# Patient Record
Sex: Female | Born: 1980 | Race: Black or African American | Hispanic: No | Marital: Married
Health system: Southern US, Community
[De-identification: ages and names within clinical notes are randomized; demographics above are authoritative.]

## PROBLEM LIST (undated history)

## (undated) DIAGNOSIS — N736 Female pelvic peritoneal adhesions (postinfective): Secondary | ICD-10-CM

## (undated) DIAGNOSIS — K611 Rectal abscess: Secondary | ICD-10-CM

## (undated) DIAGNOSIS — K5090 Crohn's disease, unspecified, without complications: Secondary | ICD-10-CM

## (undated) DIAGNOSIS — IMO0002 Reserved for concepts with insufficient information to code with codable children: Secondary | ICD-10-CM

## (undated) DIAGNOSIS — Z8619 Personal history of other infectious and parasitic diseases: Secondary | ICD-10-CM

## (undated) DIAGNOSIS — Z8742 Personal history of other diseases of the female genital tract: Secondary | ICD-10-CM

## (undated) HISTORY — DX: Reserved for concepts with insufficient information to code with codable children: IMO0002

## (undated) HISTORY — PX: COLONOSCOPY: SHX174

## (undated) HISTORY — DX: Rectal abscess: K61.1

## (undated) HISTORY — PX: LAPAROSCOPIC LYSIS INTESTINAL ADHESIONS: SUR778

## (undated) HISTORY — PX: OTHER SURGICAL HISTORY: SHX169

---

## 2001-10-20 ENCOUNTER — Encounter: Payer: Self-pay | Admitting: Emergency Medicine

## 2001-10-21 ENCOUNTER — Inpatient Hospital Stay (HOSPITAL_COMMUNITY): Admission: EM | Admit: 2001-10-21 | Discharge: 2001-10-26 | Payer: Self-pay | Admitting: Emergency Medicine

## 2001-10-23 ENCOUNTER — Encounter: Payer: Self-pay | Admitting: Internal Medicine

## 2001-11-15 ENCOUNTER — Encounter: Admission: RE | Admit: 2001-11-15 | Discharge: 2001-11-15 | Payer: Self-pay | Admitting: Internal Medicine

## 2001-12-28 ENCOUNTER — Other Ambulatory Visit: Admission: RE | Admit: 2001-12-28 | Discharge: 2001-12-28 | Payer: Self-pay | Admitting: Obstetrics and Gynecology

## 2001-12-28 ENCOUNTER — Encounter: Admission: RE | Admit: 2001-12-28 | Discharge: 2001-12-28 | Payer: Self-pay | Admitting: *Deleted

## 2002-11-22 ENCOUNTER — Encounter (INDEPENDENT_AMBULATORY_CARE_PROVIDER_SITE_OTHER): Payer: Self-pay

## 2002-11-22 ENCOUNTER — Other Ambulatory Visit: Admission: RE | Admit: 2002-11-22 | Discharge: 2002-11-22 | Payer: Self-pay | Admitting: Obstetrics and Gynecology

## 2002-11-22 ENCOUNTER — Encounter: Admission: RE | Admit: 2002-11-22 | Discharge: 2002-11-22 | Payer: Self-pay | Admitting: Obstetrics and Gynecology

## 2002-11-22 ENCOUNTER — Encounter (INDEPENDENT_AMBULATORY_CARE_PROVIDER_SITE_OTHER): Payer: Self-pay | Admitting: *Deleted

## 2003-01-15 ENCOUNTER — Emergency Department (HOSPITAL_COMMUNITY): Admission: EM | Admit: 2003-01-15 | Discharge: 2003-01-15 | Payer: Self-pay | Admitting: Emergency Medicine

## 2003-01-15 ENCOUNTER — Encounter: Payer: Self-pay | Admitting: Emergency Medicine

## 2003-09-28 HISTORY — PX: OTHER SURGICAL HISTORY: SHX169

## 2004-06-24 ENCOUNTER — Emergency Department (HOSPITAL_COMMUNITY): Admission: EM | Admit: 2004-06-24 | Discharge: 2004-06-25 | Payer: Self-pay | Admitting: Emergency Medicine

## 2004-06-25 ENCOUNTER — Inpatient Hospital Stay (HOSPITAL_COMMUNITY): Admission: AD | Admit: 2004-06-25 | Discharge: 2004-06-28 | Payer: Self-pay | Admitting: Family Medicine

## 2004-06-25 ENCOUNTER — Ambulatory Visit: Payer: Self-pay | Admitting: Family Medicine

## 2004-10-26 ENCOUNTER — Emergency Department (HOSPITAL_COMMUNITY): Admission: EM | Admit: 2004-10-26 | Discharge: 2004-10-26 | Payer: Self-pay | Admitting: Emergency Medicine

## 2004-11-01 ENCOUNTER — Emergency Department (HOSPITAL_COMMUNITY): Admission: EM | Admit: 2004-11-01 | Discharge: 2004-11-01 | Payer: Self-pay | Admitting: Emergency Medicine

## 2004-11-06 ENCOUNTER — Ambulatory Visit (HOSPITAL_COMMUNITY): Admission: RE | Admit: 2004-11-06 | Discharge: 2004-11-06 | Payer: Self-pay | Admitting: Gastroenterology

## 2004-11-06 ENCOUNTER — Encounter (INDEPENDENT_AMBULATORY_CARE_PROVIDER_SITE_OTHER): Payer: Self-pay | Admitting: Specialist

## 2004-11-24 ENCOUNTER — Ambulatory Visit: Payer: Self-pay | Admitting: Obstetrics & Gynecology

## 2004-12-17 ENCOUNTER — Emergency Department (HOSPITAL_COMMUNITY): Admission: EM | Admit: 2004-12-17 | Discharge: 2004-12-17 | Payer: Self-pay | Admitting: Emergency Medicine

## 2004-12-18 ENCOUNTER — Emergency Department (HOSPITAL_COMMUNITY): Admission: EM | Admit: 2004-12-18 | Discharge: 2004-12-18 | Payer: Self-pay | Admitting: Emergency Medicine

## 2008-04-22 ENCOUNTER — Emergency Department (HOSPITAL_COMMUNITY): Admission: EM | Admit: 2008-04-22 | Discharge: 2008-04-23 | Payer: Self-pay | Admitting: Emergency Medicine

## 2010-05-30 ENCOUNTER — Ambulatory Visit: Payer: Self-pay | Admitting: Gynecology

## 2010-05-30 ENCOUNTER — Inpatient Hospital Stay (HOSPITAL_COMMUNITY): Admission: AD | Admit: 2010-05-30 | Discharge: 2010-05-30 | Payer: Self-pay | Admitting: Obstetrics and Gynecology

## 2010-12-10 LAB — WET PREP, GENITAL
Clue Cells Wet Prep HPF POC: NONE SEEN
Trich, Wet Prep: NONE SEEN
Yeast Wet Prep HPF POC: NONE SEEN

## 2010-12-10 LAB — GC/CHLAMYDIA PROBE AMP, GENITAL
Chlamydia, DNA Probe: NEGATIVE
GC Probe Amp, Genital: NEGATIVE

## 2011-02-04 ENCOUNTER — Ambulatory Visit (HOSPITAL_COMMUNITY)
Admission: RE | Admit: 2011-02-04 | Discharge: 2011-02-04 | Disposition: A | Discharge status: Home / Self Care | Payer: Self-pay | Source: Ambulatory Visit | Attending: Gastroenterology | Admitting: Gastroenterology

## 2011-02-04 DIAGNOSIS — Z9049 Acquired absence of other specified parts of digestive tract: Secondary | ICD-10-CM | POA: Insufficient documentation

## 2011-02-04 DIAGNOSIS — K5090 Crohn's disease, unspecified, without complications: Secondary | ICD-10-CM | POA: Insufficient documentation

## 2011-02-04 DIAGNOSIS — R197 Diarrhea, unspecified: Secondary | ICD-10-CM | POA: Insufficient documentation

## 2011-02-04 DIAGNOSIS — Z9079 Acquired absence of other genital organ(s): Secondary | ICD-10-CM | POA: Insufficient documentation

## 2011-02-12 NOTE — Discharge Summary (Signed)
[place]. [facility]  Patient:    Sharon [name], Sharon [name] Visit Number: [id number] MRN: [id number]          Service Type: MED Location: [phone] Attending Physician:  Estella Husk Dictated by:   Dianah Field, P.A.-C Admit Date:  10/20/2001 Discharge Date: 10/26/2001   CC:         Vikki Ports, M.D.  Marisue Brooklyn, M.D.  Wilhemina Bonito. Eda Keys., M.D.  Dr. Gerlene Burdock at the [facility] [address], [place] [address]             0865 along with original discharge summary   Discharge Summary  ADDENDUM  LABORATORY DATA:  Stool culture was negative for Salmonella, Shigella, Campylobacter, or Yersinia.  Stool for C. diff toxin was negative.  Stool for ova and parasites was negative.  White blood cell count was ranged 10.4 to 7.0.  Hemoglobin ranged 9.6 to 8.1.  MCV was 83.1, platelets were 417,000.  On differential there was a left shift present.  Sedimentation rate ranged from 130 down to 106.  Sodium 140, potassium 3.8, chloride 105.  Glucose 90.  BUN was 5, creatinine 0.7, albumin 2.5.  Lipase was 21.  Total bilirubin 0.6, alkaline phosphatase 59, AST 9, ALT 9.  ADDITIONAL DISCHARGE DIAGNOSIS:  Normocytic anemia.  Anemia was discussed with Dr. Marina Goodell, and decision was made not to start her on iron at this point because it may upset her stomach and cause problems. However, she was advised to pick up a generic brand of multivitamin with iron and to start taking this.  On return office visit with Dr. Marina Goodell or with the internal medicine clinic, she may be able to start on some iron, and formal iron studies may be in order as an outpatient.  Add this to dictation number [id number]. Dictated by:   Dianah Field, P.A.-C Attending Physician:  Estella Husk DD:  10/26/01 TD:  10/26/01 Job: [id number]

## 2011-02-12 NOTE — Discharge Summary (Signed)
[name], Sharon                ACCOUNT NO.:  [id number]   MEDICAL RECORD NO.:  [id number]          PATIENT TYPE:  INP   LOCATION:  5714                         FACILITY:  [facility]   PHYSICIAN:  Douglass Rivers, M.D.   DATE OF BIRTH:  [birth year]   DATE OF ADMISSION:  06/25/2004  DATE OF DISCHARGE:  06/28/2004                                 DISCHARGE SUMMARY   CONSULTS:  Surgery consult with Dr. Gerrit Friends, interventional radiology.   DISCHARGE DIAGNOSES:  1.  Right pelvic abscess versus fluid collection.  2.  Crohn's disease.  3.  Anemia.  4.  Anxiety disorder.  5.  Poor intravenous access.  6.  History of genital warts.  7.  Status post colectomy in 1/05.  8.  Abdominal hernia repair in 1/05.  9.  Right oophorectomy in 1/05.   DISCHARGE MEDICATIONS:  1.  Cipro 500 mg b.i.d. x3 weeks total.  2.  Flagyl 500 mg p.o. b.i.d. x3 weeks total.  3.  Pentasa 1000 mg q.i.d.  4.  Vicodin 500/5, 1-3 tabs q.6h. p.r.n. pain.  5.  Klonopin 0.5 mg, 1 p.o. b.i.d. p.r.n. anxiety.  6.  Atarax 50 mg, 1 p.o. q.6h. p.r.n. itching.  7.  Prilosec 20 mg p.o. every day.   OTHER DIRECTIONS:  The patient was instructed to seek health care if she  were to develop fever, persistent vomiting or worsening abdominal pain.   FOLLOWUP:  The patient was unclear specifically where she wishes to have  outpatient followup in the [place] area.  She was provided with the  number at [facility] at [phone].  She will need to call to  make an appointment after getting her Medicaid card in two weeks.  If,  however, there is any delay or any problem, it is offered for patient to be  followed up as an outpatient sooner at the [facility] in one to  two weeks, and phone number was provided at [phone].   HOSPITAL COURSE:  Problem 1. Sharon [name] is a 30 year old with Crohn's disease  who presented on 9/29 with complaint of abdominal pain without associated  fever or white cell count.  CT scan  showed a right-sided abscess in the  small bowel versus a TOA.  We received information later on from [facility] in [place] that clarified that the patient underwent  resection of her right ovary and fallopian tube.  Following this, the  patient was felt to have either a postoperative fluid collection in the area  of the ovary or possibly an abscess.  Both interventional radiology and the  on-call gynecologist were consulted and neither one felt that a surgical  intervention for drainage of this fluid collection would be in the patient's  best interest and as a matter of fact felt that it may do her more harm and  may set her up for high risk of developing fistulas, which she has had in  the past.  Patient was initiated on p.o. Flagyl and p.o. Cipro, which she  tolerated the entire hospital stay.  She continued to be  afebrile without a  significantly elevated white count.  For further evaluation, a vaginal  ultrasound was performed on 06/26/04, revealed a complex right adnexal  process favored ovarian complex cyst and less consistent with a bowel or  tubo-ovarian abscess, though they felt that they were also possible  considerations.  The patient proceeded to do well on p.o. antibiotics.  On  the day of discharge she was remaining to have pain, but much significantly  less than she had had before.  She was tolerating p.o. pain medicines.  She  was tolerating a regular diet.  She was able to ambulate to the bathroom  without difficulty.  She was discharged to home with a plan to obtain some  samples from the pharmacy to help in her pharmacy costs until her Medicaid  comes through.  As stated in the discharge instructions, she was instructed  to return if she should have increasing pain, fevers or other symptoms and  is to follow up in approximately one to two weeks.   Problem 2. Anxiety disorder.  On multiple occasions, the patient appeared to  have significant issues with  regards to anxiety, and had in addition some  unpleasant interactions with some of the medical staff.  Most of this  appeared to revolve around her own concern for adequate diagnosis and care.  Of note, patient had been treated in the past with Librium and Zoloft.  Dr.  Jonny Ruiz spent approximately an hour with her trying to reinitiate therapy with  an SSRI and she was unwilling to do this at this time.  She did accept a  trial of Klonopin 0.5 mg b.i.d. and this seemed to work adequately for the  moment.  She is to be discharged on this.  It is an understanding as an  outpatient the need to continue to try and work with her to attempt to try  to ascertain daily therapy and thus control of her anxiety symptoms.   Problem 3. Crohn's disease, appeared to be stable throughout the hospital  course.  The patient was reinstated on Pentasa.  She was tolerating a  regular diet.  She did have some complaint of some perirectal pain which was  treated with some sitz baths.   Problem 4. Anemia.  The patient's hemoglobin on admission was 11.2.  The  patient was treated empirically on some iron therapy.  Further workup if  this persists may be done on an outpatient basis.   Problem 5. Poor access.  The patient has had multiple problems with poor  intravenous access.  She had an IV for a short period of time and a PIC line  was considered.  However, as patient did not require IV medicines, this was  not pursued during this hospitalization.   DISPOSITION:  The patient was discharged to home with samples of medicine as  pharmacy and social work would allow.  She was given a note for work or  school to be able to stay out for one to two weeks until her strength has  returned.       CH/MEDQ  D:  06/28/2004  T:  06/29/2004  Job:  [id number]   cc:   [facility]   Velora Heckler, M.D.  [address] [place]  [place] [address]  Fax: [phone]

## 2011-02-12 NOTE — H&P (Signed)
Sharon, [name]                ACCOUNT NO.:  [id number]   MEDICAL RECORD NO.:  [id number]          PATIENT TYPE:  EMS   LOCATION:  MAJO                         FACILITY:  [facility]   PHYSICIAN:  Madeleine B. Vanstory, M.D.DATE OF BIRTH:  1980/11/19   DATE OF ADMISSION:  06/24/2004  DATE OF DISCHARGE:                                HISTORY & PHYSICAL   HISTORY OF PRESENT ILLNESS:  The patient is a 30 year old female with a 3-  day history of abdominal pain, epigastric and right-sided, not associated  with food and patient has had no change in p.o. Positive nausea, positive  diarrhea which is chronic.  Negative for vomiting.  Negative fevers and  chills. The patient was diagnosed with Crohn's in 1996.  She had to have  about 5 hospitalizations in the last year at [facility] and [place]. She does not  have a GI doctor here in [place] and is not currently taking any  medications.  She has history of abscess x2 which were treated with  drainage.  Her last menstrual period was May 28, 2004.   REVIEW OF SYSTEMS:  Positive for weight change, she states a 15 pound  fluctuation up and down every 3 days.  Positive for abdominal pain.  Negative for chest pain, shortness of breath, constipation, headaches,  fevers, chills, hematuria, dyspareunia.   PAST MEDICAL HISTORY:  1.  Crohn's disease.  2.  Genital warts.  3.  Anxiety.   PAST SURGICAL HISTORY:  Colectomy in January, 2005, abdominal hernia repair  in January, 2005.  Right oophorectomy, January, 2005, unsure why.   MEDICATIONS:  The patient is not currently taking any medications.   ALLERGIES:  No known drug allergies.   FAMILY HISTORY:  Dad:  sleep apnea and asthma.  Mom:  bipolar.   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  No pets. Denies smoking, ETOH or illicit drugs.   PHYSICAL EXAMINATION:  VITAL SIGNS:  Temperature 97.3, pulse 80's, 20's,  blood pressure 107 to 130/60  to 95. Temperature 98, saturation 100% on room  air.  GENERAL:  The patient is lying in bed in no acute distress though appears to  be in pain.  HEENT:  Extraocular muscles intact.  Pupils are equal, round and reactive to  light, questionable exophthalmos.  NECK:  Negative lymphadenopathy, no thyromegaly.  CARDIOVASCULAR:  Regular rate and rhythm.  RESPIRATORY:  Clear to auscultation bilaterally. No cyanosis, no clubbing.  ABDOMEN:  Tender to palpation right greater than left side, positive  guarding, no rebound tenderness.  EXTREMITIES:  Warm, 2+ peripheral pulses, free range of motion.  Five +  strength bilaterally.  RECTAL:  Perianal hyperpigmentation, no frank blood per rectum.  No stool in  the vault, Hemoccult pending.   LABORATORY DATA:  Urinalysis negative, positive for small leukocyte  esterase, otherwise negative.  Pregnancy test negative.  Microscopic UA:  rare bacteria, rare epithelium.  White count 7400, hematocrit 30.21,  hemoglobin 11.2, platelets 299, 000.   CT of the abdomen showed a 3.5 x 4.5  cm abscess in the right pelvis, mildly  thickened small bowel, questionable Crohn's versus TOA though patient has  had right oophorectomy.   ASSESSMENT AND PLAN:  This is a 30 year old female with:  1.  Abdominal pain, likely with Crohn's flare as patient is noncompliant.      Of note, no GI follow up. CT shows right sided abscess, small bowel      versus TOA but again patient has had history of a right oophorectomy      (unsure why).  Pregnancy test negative therefore not ectopic pregnancy.      No fever, increased white blood count and no signs of appendicitis on      CT.  No history of recent surgery or signs of obstruction.  Will treat      patient with Flagyl 500 mg p.o. t.i.d. and Cipro 500 mg p.o. b.i.d. and      restart her Pentasa 1000 mg q.i.d., and Darvocet 50/325 q.4h p.r.n.      pain., Phenergan 12.5 mg p.o. q.4-5h p.r.n. nausea.  Will make patient      n.p.o.   Gastroenterology consult in the a.m.  May need steroid and/or a      pelvic exam.   1.  Other.  The patient is requesting to leave and come back to go home to      clean up her room before her landlord comes over, though it is 3:30 in      the morning.  She then states she has a cup of urine in the bathroom      that she needs to throw away because she is embarrassed.  Then states      she has to go home to get notes.  I told her if she leaves it will be      AMA, that she can call to get help with the aforementioned scenario.       MBV/MEDQ  D:  06/25/2004  T:  06/25/2004  Job:  [id number]

## 2011-02-12 NOTE — Group Therapy Note (Signed)
Sharon [name], Sharon [name]                ACCOUNT NO.:  [id number]   MEDICAL RECORD NO.:  [id number]          PATIENT TYPE:  WOC   LOCATION:  [facility]                   FACILITY:  [facility]   PHYSICIAN:  Elsie Lincoln, MD      DATE OF BIRTH:  11/18/1980   DATE OF SERVICE:  11/24/2004                                    CLINIC NOTE   REASON FOR VISIT:  The patient is a 30 year old female who presents after no  showing after an abnormal Pap smear. 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 The biopsy of the cervix following that shows  slight dysplasia. Back in 2003 she did have moderate dysplasia consistent  with high-grade; however, never underwent LEEP, and the patient has not come  back since February 2004. We talked about her noncompliance and she agrees  to be compliant from now on. She also is requesting a complete sexually-  transmitted disease screen. She denies any high-risk behavior including IV  drug use, anal sex, travel to countries where HIV is prevalent, or having  sex with people who have high-risk behavior. She has been with her boyfriend  for 7 years. She denies him not being monogamous. I do have some feeling  that I am not getting the complete story. However, we will test her for  everything. The patient has a history of Crohn's disease and has active  perianal fistulas that she says does not bother her. The patient also has  recent perineal itching but no discharge. This itching could be related to  the fistulas.   PHYSICAL EXAMINATION:  Several fistulas are surrounding the anus. The one on  the right is slightly weeping. External genitalia:  No warts or lesions.  Some evidence of poor hygiene in the labia majora. Vagina pink, normal  rugae, no discharge, no blood. Cervix closed, no evidence of infection.  There is a small amount of physiologic discharge.   ASSESSMENT AND PLAN:  A 29 year old female with abnormal Pap smear for  follow-up and  sexually-transmitted disease screening.   1.  GC, chlamydia, wet prep, RPR, HIV, hepatitis B surface antigen, and      hepatitis C virus sent.  2.  Will treat empirically for yeast with Diflucan 150 mg p.o. x1 and repeat      in 3 days if needed.  3.  Return to clinic in 2 weeks for results. The patient will most likely      call in earlier for results as she is very anxious to receive these.      This is another reason why I think that I am not getting the entire      story.  4.  If results are negative, R.N. can give results over the phone.      KL/MEDQ  D:  11/24/2004  T:  11/25/2004  Job:  [id number]

## 2011-02-12 NOTE — H&P (Signed)
[place]. [facility]  Patient:    Sharon [name], Sharon [name] Visit Number: [id number] MRN: [id number]          Service Type: MED Location: [phone] Attending Physician:  Estella Husk Dictated by:   Sammuel Cooper, P.A. Admit Date:  10/20/2001                           History and Physical  CHIEF COMPLAINT:  Abdominal pain and right shoulder and right knee pain x1 week.  HISTORY:  Sharon [name] is a pleasant 30 year old [place] American female, student at [employer], who has history of Crohns disease which was apparently diagnosed about two years ago.  She had initially been followed by a Dr. Carole Civil in [place] and had more recently seen a doctor in [place], [place].  She has not established a GI physician in [place].  She does not have any other medical problems and has not required any surgery.  She said she was placed on steroids in June of 2002 when she had a flare and gradually weaned off of all of her medication over the past several months.  She now presents with increase in right-sided abdominal pain, which she says is fairly chronic for her, though worse over the past week, and has developed right knee and right shoulder pain, which were unusual.  She had not had any documented fever or chills.  She has been nauseated but had not had any vomiting at home.  She does have some periodic diarrhea, has not noticed any melena or hematochezia. Her weight had been stable over the past few months but she had lost approximately 30 pounds within the past year or so.  She admits that she does not generally take medications as she is supposed to and had stopped her Pentasa on her own.  The patient had been seen and evaluated in the emergency room, had undergone CT scan of the chest which was negative and CT of the abdomen and pelvis which did show thickened small bowel in the terminal ileum with some fluid in the pelvis, could not rule out loculated fluid  or abscess.  Exam was otherwise negative.  GI was called and the patient is admitted to the GI service at this time with probable Crohns exacerbation.  CURRENT MEDICATIONS:  None on a regular basis.  ALLERGIES:  No known drug allergies.  PAST HISTORY:  Benign other than Crohns, which was diagnosed approximately two years ago.  FAMILY HISTORY:  Patient says she is uncertain whether or not her mother has Crohns disease.  Apparently, her mother has had some psychological issues. Family history is otherwise negative as far as the patient is aware.  SOCIAL HISTORY:  Patient is a Consulting civil engineer at [employer], hopes to go to nursing school. She is currently living on campus.  She occasionally smokes socially and uses alcohol socially.  REVIEW OF SYSTEMS:  Negative other than described above.  She had no complaint of chest pain or anginal symptoms, no cough, shortness of breath or sputum production, no dysuria, urgency or frequency.  GI and musculoskeletal as outlined above.  LABORATORY DATA:  Labs pertinent for WBC of 10.4 and CMET unremarkable.  PHYSICAL EXAMINATION:  GENERAL:  Well-developed, thin, African American female in no acute distress on admission.  VITAL SIGNS:  Blood pressure 93/54, pulse is 83, temperature is 97.4.  HEENT:  Nontraumatic, normocephalic.  EOMI.  PERLA.  Sclerae anicteric.  NECK:  Supple.  CARDIOVASCULAR:  Regular rate and rhythm with S1 and S2.  No murmurs, rubs, or gallops.  PULMONARY:  Clear to P&A.  ABDOMEN:  Soft with minimal right midabdominal tenderness.  Bowel sounds were present.  There is no palpable mass or hepatosplenomegaly.  EXTREMITIES:  Without clubbing, cyanosis, or edema.  RECTAL:  Not done at the time of admission.  IMPRESSION: 1. [age]-year-old white female with Crohns ileitis with exacerbation. 2. Medical noncompliance.  PLAN:  Patient is admitted to the service of Dr. Wilhemina Bonito. Eda Keys. for IV fluid hydration.  She will be covered  with oral Cipro and Flagyl, given CT changes, will also resume her Pentasa, pain control and antiemetics as needed and depending on her clinical course, she may require IV Solu-Medrol and further diagnostic workup with barium studies, etc.  We will also need to obtain her prior colonoscopy and GI workup from [place].Dictated by:   Sammuel Cooper, P.A. Attending Physician:  Estella Husk DD:  10/22/01 TD:  10/23/01 Job: [id number]

## 2011-02-12 NOTE — Op Note (Signed)
NAMEMARGARUITE, TOP                ACCOUNT NO.:  [id number]   MEDICAL RECORD NO.:  [id number]          PATIENT TYPE:  AMB   LOCATION:  ENDO                         [facility]ACILITY:  [facility]   PHYSICIAN:  Danise Edge, M.D.   DATE OF BIRTH:  30-Aug-1981   DATE OF PROCEDURE:  11/06/2004  DATE OF DISCHARGE:                                 OPERATIVE REPORT   PROCEDURE:  Diagnostic colonoscopy.   INDICATIONS:  Sharon [name] is a 30 year old female born 01/12/1981.  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   Sharon [name] was diagnosed with Crohns disease in 1996 when she began passing  blood in her stool. She has undergone a segmental right colon resection,  abdominal wall hernia repair, and removal of her right ovary and tube in the  past.   On October 20, 2001, her abdominal ultrasound was normal. On October 23, 2001, her small-bowel follow-through x-ray series revealed Crohn's disease  involving the distal-terminal ileum.   October 27, 2003, transvaginal and pelvic ultrasound revealed a complex  right ovarian cyst.   October 26, 2004, CT scan of the abdomen and pelvis revealed an umbilical  hernia and a 7.5 x 9 cm pelvic cyst ? arising from the left ovary.   October 26, 2004, white blood cell count 13,300, hemoglobin 11.2 g,  normocytic indices, normal platelet count. Complete metabolic profile was  normal except for a serum albumin of 3.2. Serum lipase normal,  urine  pregnancy test negative, urinalysis normal.   Sharon [name] is experiencing intermittent urinary frequency without dysuria,  bilateral retro-orbital pressure, and nonbloody diarrhea.   Years ago, she was started on 6-MCP, but the drug was discontinued when she  failed to return for monitoring.   MEDICATION ALLERGIES:  None.   PAST MEDICAL HISTORY:  Crohn's ileitis, genital warts, anxiety segmental  right colon resection, abdominal hernia repair,  right oophorectomy and  salpingectomy.   FAMILY HISTORY:  Father has sleep apnea and asthma, mother is bipolar.   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   HABITS:  No history of cigarette smoking, alcohol use.   ENDOSCOPIST:  Danise Edge, M.D.   PREMEDICATION:  Versed 10 mg Demerol 70 mg.   PROCEDURE:  After obtaining informed consent,  Sharon [name] was placed in the  left lateral decubitus position. I administered intravenous Demerol and  intravenous Versed to achieve conscious sedation for the procedure. The  patient's blood pressure, oxygen saturation and cardiac rhythm were  monitored throughout the procedure and documented in the medical record.   Anal inspection revealed healed perianal fistulous disease. Digital rectal  exam is normal. The Olympus adjustable pediatric colonoscope was introduced  into the rectum and easily advanced to the ileo right colonic surgical  anastomosis. Colonic preparation for the exam today was excellent   RECTUM:  Normal.  SIGMOID COLON AND DESCENDING COLON:  Normal.  SPLENIC FLEXURE:  Normal.  TRANSVERSE COLON:  Normal.  HEPATIC FLEXURE:  Normal.  ASCENDING COLON:  Normal.  ILEO RIGHT  COLONIC SURGICAL ANASTOMOSIS:  Patent.  DISTAL ILEUM:  There are scattered aphthous type ulcers compatible with  Crohn's ileitis without the ulcers or obstruction.   ASSESSMENT:  Crohn's ileitis. Segmental right colon resection with widely  patent ileo right colonic surgical anastomosis. Normal rectal and colonic  mucosa.   PLAN:  1.  Sharon [name] has an upper respiratory tract infection and is running in      temperature of 101 degrees.  I am placing her on amoxicillin 500 mg      t.i.d. for 10 days.  2.  I am obtaining a free T4 and TSH level. She appears to have some degree      of proptosis.  3.  I will meet with Sharon [name] in my office to go over her biopsies from the      ileum.  4.  Her October 26, 2004 CT scan of the abdomen and pelvis performed at the       [facility] did show a large pelvic cyst pressing      in on her urinary bladder. She will need to see a gynecologist.  She      will probably need a repeat pelvic and transvaginal ultrasound to      determine if this is indeed arising from the left ovary as suspected by      the CT scan.      MJ/MEDQ  D:  11/06/2004  T:  11/06/2004  Job:  [id number]   cc:   Vikki Ports, MD  [address], [address]  [place]  [place] [address]

## 2011-02-12 NOTE — Discharge Summary (Signed)
[place]. [facility]  Patient:    Sharon, [name] Visit Number: [id number] MRN: [id number]          Service Type: MED Location: [phone] Attending Physician:  Estella Husk Dictated by:   Dianah Field, P.A. Admit Date:  10/20/2001 Discharge Date: 10/26/2001   CC:         Sharon [name], M.D.  Sharon [name], M.D.   Discharge Summary  ADMITTING DIAGNOSES: 1. Abdominal, right shoulder and right knee pain.  Abdominal secondary to    exacerbation of Crohns ileitis. 2. 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 3. Medical noncompliance with Crohns disease medical therapy.  Some of this    due to social issues and lack of medical insurance.  DISCHARGE DIAGNOSES: 1. 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  Symptoms much    improved on medical therapy with Pentasa, Flagyl and Cipro. 2. Active perirectal Crohns disease with fistulous disease. 3. History of medical noncompliance.  CONSULTATION:  Sharon [name], M.D., surgical evaluation for perirectal fistulae.  PROCEDURES:  None.  HISTORY OF PRESENT ILLNESS:  [name] is a nice, 30 year old, African-American female.  She is a Consulting civil engineer at [employer].  She has a history of Crohns ileitis which was originally diagnosed in [place] in approximately 1999, by Dr. Rolley Sims.  Details of what was diagnosed in [place] are not available, but records from later evaluations by a Dr. Gerlene [name], in [place], [place], were available.  At that time, she had had a colonoscopy which confirmed Crohns ileitis.  She also had perianal fistula and mild scarring of the anal canal.  He did not intubate the terminal ileum. She underwent small bowel series around the same time which was May 2002. This showed findings compatible with active Crohns disease involving at least 15 cm of the terminal ileum, if not more.  There was some associated  mass effect with the Crohns disease.  It also had the appearance which was strongly suggestive of fistulous formation between the terminal ileum and rectosigmoid.  She had been treated at that time by Dr. Lucile [name] with Pentaza 1 g four times a day, 6-mercaptopurine at 15 mg a day as well as ferrous sulfate and prednisone taper.  However, the patient again became noncompliant with medical therapy.  She had not returned recently to see Dr. Gerome Apley.  The patient presented to the health clinic at [facility] with approximately one week of right-sided abdominal pain with a pleuritic component.  She was also complaining of right knee pain and tenderness.  White blood cell count was 14,000 and her sedimentation rate was elevated at 94.  She was then referred over to the emergency room at [facility].  While there, she underwent a normal abdominal ultrasound.  A CT scan of the chest was negative for pulmonary embolism or significant abnormality.  Lower extremity CT was negative for acute DVT.  The CT scan of the abdomen and pelvis, however, showed moderate thickening of loops of small bowel, adjacent inflammation and associated free fluid within the pelvis.  There was question of fluid loculation and abscess formation could not be ruled out.  The appearance was suggestive of Crohns disease, but other inflammatory, as well as infectious enteritis was not ruled out.  There were reactive appearing scattered mesenteric lymphadenopathy as well.  There was also on abdominal views, a small periumbilical hernia which contained a small amount of small bowel, but no definite  small bowel obstruction.  Dr. Yancey [name] was ultimately called for evaluation of the patient.  He was covering unassigned GI call that day.  Dr. Marina Goodell admitted the patient for flare of Crohns ileitis with control of her abdominal pain.  HOSPITAL COURSE:  The patient was admitted to unit 5700.  She was stable throughout her  hospitalization.  She was started immediately on oral Pentasa, but was not started on steroids.  She was also started initially on IV Cipro and Flagyl, but ultimately these medications were changed over to oral formulations.  Over the course of the patients hospitalization, she had much improvement in her symptoms.  She did have some dry heaves, but these resolved.  Her right lower quadrant pain improved.  At first on abdominal exam, she was tender without guarding or rebound in the area of the right lower quadrant.  By the end of her hospitalization, this region was nontender.  Diet initially was started off at clear liquids, but ultimately she was tolerating full liquids and then a full residue diet.  Dr. Luan Pulling evaluated the patients active perirectal fistulae.  These have been draining mucusy and purulent type drainage for over a year.  Of note, they were noted by Dr. Lucile [name] during his colonoscopy report.  For the time being, Dr. Luan Pulling felt that antibiotic therapy would be the treatment of choice, but she had a low threshold for taking the patient to surgery for drainage under anesthesia.  Plan was for her to follow up with Dr. Luan Pulling in the office soon after discharge for further evaluation.  The patient tolerated the Pentasa as well as the oral Cipro and Flagyl.  She was afebrile.  Her white blood cell count was never elevated during this hospitalization.  By the time of her discharge, she was felt stable to return to school.  The patient was seen by care management for help with social issues.  She was given application for Medicaid program.  Also, arrangements were made for to tap into the indigent medication resource fund where she was to receive $200 worth of medication at discharge.  There was some question as to TB in this patient raised during her initial evaluation in the emergency room.  A PPD was placed and this site did not  react during this  hospitalization.  Incidentally, the patients chest pain resolved.  CONDITION ON DISCHARGE:  Stable and improved.  DISCHARGE MEDICATIONS: 1. Pentasa 250 mg capsules four p.o. q.i.d. 2. Ciprofloxacin 500 mg one p.o. b.i.d. 3. Flagyl/metronidazole 500 mg one p.o. t.i.d.  She was advised not to drink    any alcohol while she was taking Flagyl.  ACTIVITY:  The patient was okay to return to classes as soon as January 31, although her classes are on Tuesday and Thursday, so a Friday return to class was not an issue for her.  FOLLOWUP:  She has an appointment with Dr. Luan Pulling on February 11, at 10 a.m.  She has an appointment with Dr. Marina Goodell on February 14, at 11:30 a.m. She has an appointment at the internal medicine outpatient clinic program on February 19, at 1:30 p.m.  Exact assignment as to which resident will be seeing her has not been set in stone yet, but she may see Dr. Bettye Boeck who arranged the appointment for her. Dictated by:   Dianah Field, P.A.  Attending Physician:  Estella Husk DD:  10/26/01 TD:  10/26/01 Job: [phone]

## 2011-02-25 ENCOUNTER — Ambulatory Visit (HOSPITAL_COMMUNITY)
Admission: RE | Admit: 2011-02-25 | Discharge: 2011-02-25 | Disposition: A | Discharge status: Home / Self Care | Payer: Self-pay | Source: Ambulatory Visit | Attending: Gastroenterology | Admitting: Gastroenterology

## 2011-02-25 ENCOUNTER — Other Ambulatory Visit: Payer: Self-pay | Admitting: Gastroenterology

## 2011-02-25 DIAGNOSIS — K5000 Crohn's disease of small intestine without complications: Secondary | ICD-10-CM | POA: Insufficient documentation

## 2011-02-25 DIAGNOSIS — K6289 Other specified diseases of anus and rectum: Secondary | ICD-10-CM | POA: Insufficient documentation

## 2011-02-25 DIAGNOSIS — Z9049 Acquired absence of other specified parts of digestive tract: Secondary | ICD-10-CM | POA: Insufficient documentation

## 2011-03-15 NOTE — Op Note (Signed)
[name], POUNDS                ACCOUNT NO.:  [id number]  MEDICAL RECORD NO.:  [id number]           PATIENT TYPE:  O  LOCATION:  [facility]                         FACILITY:  [facility]  PHYSICIAN:  Danise Edge, M.D.   DATE OF BIRTH:  September 25, 1981  DATE OF PROCEDURE:  02/25/2011 DATE OF DISCHARGE:                              OPERATIVE REPORT   PROCEDURE PERFORMED:  Diagnostic colonoscopy.  REFERRING PHYSICIAN:  Della Goo, M.D.  HISTORY:  Ms. Tannya [name] is a 30 year old female, born [birth year].  The patient was diagnosed with Crohn's ileitis in 1996 when she began passing blood in her stool.  She has undergone a terminal ileum and segmental right colon resection with abdominal wall hernia repair and removal of her right ovary and tube in the past.  On October 20, 2001, the patient's abdominal ultrasound was normal.  Her barium small bowel follow-through x-ray series showed Crohn's disease involving the distal ileum.  On October 27, 2003, transvaginal and pelvic ultrasound showed a complex right ovarian cyst.  On October 26, 2004, CT scan of the abdomen and pelvis showed an umbilical hernia and a 9 cm pelvic cyst arising from the left ovary.  On October 26, 2004, the patient's white blood cell count was 13,300, hemoglobin 11.2 grams, normocytic indices and normal platelet count. Her complete metabolic profile was normal except for a serum albumin 3.2.  Serum lipase was normal.  Urinalysis was normal.  Years ago, the patient was started on 6-mercaptopurine, but the drug was discontinued when she failed to return for drug toxicity monitoring.  On November 06, 2004, the patient underwent a diagnostic colonoscopy, which showed Crohn's ileitis, post terminal ileum and segmental right colon resection associated with a widely patent ileal - right colonic surgical anastomosis and normal-appearing rectal and colonic mucosa.  ALLERGIES:  The patient reports no medication  allergies.  PAST MEDICAL HISTORY:  Reveals: 1. Chronic Crohn's ileitis. 2. History of genital warts. 3. Anxiety. 4. Remote terminal ileum - segmental right colon resection with     abdominal wall hernia repair and right oophorectomy and     salpingectomy.  The patient is currently on no therapy for Crohn disease.  She has been passing loose bowel movements associated with blood and mucus.  She denies abdominal pain or fever.  Her appetite remains stable.  She has developed perianal fistulas in the past.  Her stool screen for C. Difficile toxin, Giardia and Cryptosporidium was negative.  ENDOSCOPIST:  Danise Edge, M.D.  PREMEDICATION: 1. Fentanyl 100 mcg. 2. Versed 10 mg.  PROCEDURE IN DETAIL:  Anal inspection reveals healed perianal fistulas. There are no actively draining fistulas or abscesses.  Digital rectal exam was normal.  The Pentax pediatric colonoscope was introduced into the rectum and advanced to the ileal right colonic surgical anastomosis. Colonic preparation for the exam today was good.  Rectum:  There are red folds in the distal rectum, which were biopsied. There are no ulcers.  Sigmoid colon and descending colon:  Normal.  Splenic flexure:  Normal.  Transverse colon:  Normal.  Hepatic flexure:  Normal.  Ascending colon:  Normal.  Ileal right colonic surgical anastomosis:  Patent.  Distal ileum:  Showed scattered aphthous ulcers without bleeding or stricture formation.  ASSESSMENT: 1. Active Crohn's ileitis. 2. Normal colonoscopy from the ileal right colonic surgical     anastomosis to the rectum. 3. Red folds in the distal rectum, biopsied.  PLAN:  A meet with [name] in my office to discuss starting mesalamine therapy for active Crohn's ileitis.          ______________________________ Danise Edge, M.D.    MJ/MEDQ  D:  02/25/2011  T:  02/25/2011  Job:  [id number]  cc:   Della Goo, M.D.  Electronically Signed by Danise Edge M.D. on 03/15/2011 04:15:25 PM

## 2011-06-25 LAB — DIFFERENTIAL
Basophils Absolute: 0.1
Basophils Relative: 1
Eosinophils Absolute: 0.1
Eosinophils Relative: 1
Lymphocytes Relative: 19
Lymphs Abs: 1.5
Monocytes Absolute: 0.4
Monocytes Relative: 6
Neutro Abs: 5.6
Neutrophils Relative %: 74

## 2011-06-25 LAB — RAPID URINE DRUG SCREEN, HOSP PERFORMED
Amphetamines: NOT DETECTED
Barbiturates: NOT DETECTED
Benzodiazepines: NOT DETECTED
Cocaine: NOT DETECTED
Opiates: NOT DETECTED
Tetrahydrocannabinol: POSITIVE — AB

## 2011-06-25 LAB — URINALYSIS, ROUTINE W REFLEX MICROSCOPIC
Bilirubin Urine: NEGATIVE
Glucose, UA: NEGATIVE
Hgb urine dipstick: NEGATIVE
Ketones, ur: NEGATIVE
Nitrite: NEGATIVE
Protein, ur: NEGATIVE
Specific Gravity, Urine: 1.011
Urobilinogen, UA: 0.2
pH: 6

## 2011-06-25 LAB — CBC
HCT: 35.1 — ABNORMAL LOW
Hemoglobin: 11.6 — ABNORMAL LOW
MCHC: 33.1
MCV: 93.9
Platelets: 320
RBC: 3.74 — ABNORMAL LOW
RDW: 13.1
WBC: 7.6

## 2011-06-25 LAB — POCT I-STAT, CHEM 8
Calcium, Ion: 1.18
HCT: 36
TCO2: 27

## 2011-06-25 LAB — SALICYLATE LEVEL: Salicylate Lvl: <4

## 2011-06-25 LAB — ACETAMINOPHEN LEVEL: Acetaminophen (Tylenol), Serum: <10 — ABNORMAL LOW

## 2011-06-25 LAB — POCT PREGNANCY, URINE: Preg Test, Ur: NEGATIVE

## 2011-06-25 LAB — ETHANOL: Alcohol, Ethyl (B): <5

## 2011-07-19 ENCOUNTER — Inpatient Hospital Stay (INDEPENDENT_AMBULATORY_CARE_PROVIDER_SITE_OTHER)
Admission: RE | Admit: 2011-07-19 | Discharge: 2011-07-19 | Disposition: A | Discharge status: Home / Self Care | Payer: Self-pay | Source: Ambulatory Visit | Attending: Emergency Medicine | Admitting: Emergency Medicine

## 2011-07-19 ENCOUNTER — Ambulatory Visit (INDEPENDENT_AMBULATORY_CARE_PROVIDER_SITE_OTHER): Payer: Self-pay

## 2011-07-19 DIAGNOSIS — S93609A Unspecified sprain of unspecified foot, initial encounter: Secondary | ICD-10-CM

## 2011-09-09 ENCOUNTER — Ambulatory Visit (HOSPITAL_COMMUNITY)
Admission: RE | Admit: 2011-09-09 | Discharge: 2011-09-09 | Disposition: A | Discharge status: Home / Self Care | Payer: Self-pay | Source: Ambulatory Visit | Attending: Orthopaedic Surgery | Admitting: Orthopaedic Surgery

## 2011-09-09 ENCOUNTER — Other Ambulatory Visit (HOSPITAL_COMMUNITY): Payer: Self-pay | Admitting: Orthopaedic Surgery

## 2011-09-09 DIAGNOSIS — M25579 Pain in unspecified ankle and joints of unspecified foot: Secondary | ICD-10-CM | POA: Insufficient documentation

## 2011-09-09 DIAGNOSIS — T148XXA Other injury of unspecified body region, initial encounter: Secondary | ICD-10-CM

## 2011-10-15 ENCOUNTER — Encounter: Payer: Self-pay | Admitting: Obstetrics and Gynecology

## 2011-10-16 ENCOUNTER — Emergency Department (INDEPENDENT_AMBULATORY_CARE_PROVIDER_SITE_OTHER)
Admission: EM | Admit: 2011-10-16 | Discharge: 2011-10-16 | Discharge status: Against Medical Advice | Payer: Self-pay | Source: Home / Self Care

## 2011-10-16 ENCOUNTER — Encounter (HOSPITAL_COMMUNITY): Payer: Self-pay | Admitting: *Deleted

## 2011-10-16 DIAGNOSIS — M25579 Pain in unspecified ankle and joints of unspecified foot: Secondary | ICD-10-CM

## 2011-10-16 DIAGNOSIS — M25572 Pain in left ankle and joints of left foot: Secondary | ICD-10-CM

## 2011-10-16 HISTORY — DX: Crohn's disease, unspecified, without complications: K50.90

## 2011-10-16 NOTE — ED Notes (Signed)
Reports fracturing left ankle approx 6 wks ago.  Saw [facility] approx 2 wks ago and was told she could return to work.  Pt does not have another f/u appt scheduled.  Wondering if fx is healed now.  Has been wearing cam walker as directed.  Continues with pain.

## 2011-10-16 NOTE — ED Provider Notes (Signed)
History     CSN: [id number]  Arrival date & time 10/16/11  1121   None     Chief Complaint  Patient presents with  . Ankle Pain    (Consider location/radiation/quality/duration/timing/severity/associated sxs/prior treatment) HPI Comments: Pt presents requesting an xray of her ankle to follow up on an ankle fracture that occurred approx 6 weeks ago. She admits to seeing an orthopedic doctor "a couple weeks ago" and states that she doesn't want to return to him for follow up, that she cannot afford to return for follow up, and that he has cleared her to return to work and she wants to make sure that everything is OK. She also states that she ran out of her pain medication 2 weeks ago - Ocycodone, and has been taking Ibuprofen without relief of pain. Chart review shows a follow up left ankle xray done on 09-09-11 with poor healing fibula fracture, but last prior film to this was 11-01-04 and was neg for fracture.    Past Medical History  Diagnosis Date  . Crohn's disease     Past Surgical History  Procedure Date  . Oophorectomy   . Hernia repair   . Laparoscopic lysis intestinal adhesions   . Bladder perforation   . Bowel resection     History reviewed. No pertinent family history.  History  Substance Use Topics  . Smoking status: Never Smoker   . Smokeless tobacco: Not on file  . Alcohol Use: No    OB History    Grav Para Term Preterm Abortions TAB SAB Ect Mult Living                  Review of Systems  Constitutional: Negative for fever and chills.  Cardiovascular: Negative for leg swelling.  Musculoskeletal: Negative for myalgias and joint swelling.  Skin: Negative for color change and wound.  Neurological: Negative for weakness and numbness.    Allergies  Review of patient's allergies indicates no known allergies.  Home Medications  No current outpatient prescriptions on file.  BP 122/77  Pulse 83  Temp(Src) 99.1 F (37.3 C) (Oral)  Resp 18  SpO2 100%   LMP 10/02/2011  Physical Exam  Nursing note and vitals reviewed. Constitutional: She appears well-developed and well-nourished. No distress.  HENT:  Head: Normocephalic and atraumatic.  Cardiovascular:  Pulses:      Dorsalis pedis pulses are 2+ on the left side.       Posterior tibial pulses are 2+ on the left side.  Musculoskeletal:       Left ankle: She exhibits normal range of motion, no swelling, no ecchymosis, no deformity, no laceration and normal pulse. tenderness. Lateral malleolus (and distal fibula) tenderness found. No medial malleolus, no AITFL, no CF ligament, no posterior TFL, no head of 5th metatarsal and no proximal fibula tenderness found. Achilles tendon normal.       Left foot: She exhibits normal range of motion, no tenderness, no bony tenderness, no swelling, normal capillary refill and no deformity.  Neurological: She is alert. Gait (Pt noted walking from chair to exam table, weight bearing on lateral Left foot with inverted. ) abnormal.  Skin: Skin is warm and dry. No erythema.  Psychiatric: She has a normal mood and affect.    ED Course  Procedures (including critical care time)  Labs Reviewed - No data to display No results found.   1. Ankle pain, left       MDM  Lt ankle Xray 09/09/11 :  Poorly healing distal fibular fracture with slightly increased  posterior cortical separation compared to the prior study.  Pt became angry when I explained that she needed to follow up with her orthopedic dr regarding her ankle fx and f/u xray. Discussed with her that we are not orthopedic specialists and that her xray in Dec showed poor healing. That it would be best for the orthopedist to recheck this for proper healing.  Pt then stated, "what if I am having pain?"  I said "are you having pain?" And she replied yes, that she was taking Oxycodone but had run out. I told pt that I prescribe Ibuprofen for her discomfort and she advised me that she cannot take NSAIDs due  to Chron's. She then requested a prescription for Ultram. I told her I would prescribe a few pills to last until she could see Dr Margreta Journey this next week. Pt again requested an ankle xray and asked "if I told you that I re-injured the ankle would you order an xray?" I told her that yes, if she had another injury that would be appropriate reason to x-ray her ankle today. "So you want your patients to lie to you?" she responded. And I said "no, we want our patients to tell the truth." She then began saying that she couldn't believe that she wanted here this long to be seen and she is not going to get an xray, that I have done nothing for her today and told me "get out of my face" and to leave the exam room. I tried to reason with the patient that I was treating her pain though I was not going to do an xray as she requested and she continued to argue with me telling me to get out of her face, to forget the prescription, and to leave the room. I then left and pt shortly after walked out of the clinic.         Melody Comas, [place] 10/25/11 [phone]

## 2011-10-29 NOTE — ED Provider Notes (Signed)
Medical screening examination/treatment/procedure(s) were performed by non-physician practitioner and as supervising physician I was immediately available for consultation/collaboration.  LANEY,RONNIE   Ronnie Laney, MD 10/29/11 2015 

## 2012-03-27 ENCOUNTER — Encounter (INDEPENDENT_AMBULATORY_CARE_PROVIDER_SITE_OTHER): Payer: Self-pay | Admitting: Surgery

## 2012-03-27 ENCOUNTER — Other Ambulatory Visit: Payer: Self-pay | Admitting: Gastroenterology

## 2012-03-27 ENCOUNTER — Ambulatory Visit (INDEPENDENT_AMBULATORY_CARE_PROVIDER_SITE_OTHER): Payer: BC Managed Care – PPO | Admitting: Surgery

## 2012-03-27 VITALS — BP 120/76 | HR 92 | Temp 97.8°F | Resp 16 | Ht 66.0 in | Wt 185.0 lb

## 2012-03-27 DIAGNOSIS — K610 Anal abscess: Secondary | ICD-10-CM | POA: Insufficient documentation

## 2012-03-27 DIAGNOSIS — K5090 Crohn's disease, unspecified, without complications: Secondary | ICD-10-CM | POA: Insufficient documentation

## 2012-03-27 DIAGNOSIS — K612 Anorectal abscess: Secondary | ICD-10-CM

## 2012-03-27 NOTE — Progress Notes (Addendum)
[facility]  Ovidio Kin, MD,  FACS [address] [place]  [address] [place], [place]    [address] Phone:  [phone] FAX:  [phone]   Re:   Sharon [name] DOB:   [birth year] MRN:   [id number]  Urgent Office  ASSESSMENT AND PLAN: 1.  Perianal abscess - Extensive and bilateral  She points to an area on the right rectum that is bothering her the most.  I did a I&D of this area.  Most of the abscess I think had drained.  She has been given Augmentin by Dr. Laural Benes.  This is a difficult long term problem.  And though she does not have colonic Crohn's according to Dr. Laural Benes, this perianal fistulous disease is still probably related to her Crohn's disease.  I have left her appt open.  She knows to call if she has problems.  2.  Crohn's disease since 2002.  Seeing Dr. Josefa Half - I spoke to him on the phone.  She is set up for a SB series to evaluated the extent of her disease.  She has had some crampy pain in her epigastrium after eating.  And she is have watery and bloody stools (around 5 per day).    She had her last colonoscopy in December 2012 by Dr. Josefa Half.  HISTORY OF PRESENT ILLNESS: Chief Complaint  Patient presents with  . Abscess    urge- eval perirectal abscess    Sharon [name] is a 31 y.o. (DOB: Nov 30, 1980)  AA female who is a patient of Lillia Mountain, MD and comes to me today for perianal abscess.  She has had known Crohn's disease since 2002.  She had a small bowel resection in [place] in 2004.  That has been her only surgery for Crohn's.  She has also had surgery for a hernia and for the right ovary.  She is currently on no meds for her Crohn's disease.  In talking to Dr. Laural Benes, Sharon [name] does not reliably follow up.  And this has limited the treatment for her Crohn's disease.  After her small bowel series, Dr. Laural Benes is hoping to get her on some biologics to control her Crohn's disease.  Social History. Not married and no  children. Father is [place].  But she was born in [place]. Works as a Pharmacist, hospital for [employer].  PHYSICAL EXAM: BP 120/76  Pulse 92  Temp 97.8 F (36.6 C) (Temporal)  Resp 16  Ht 5\' 6"  (1.676 m)  Wt 185 lb (83.915 kg)  BMI 29.86 kg/m2  General: AA F who is alert and generally healthy appearing.  HEENT: Normal. Pupils equal. Good dentition. Lungs: Clear to auscultation and symmetric breath sounds. Heart:  RRR. No murmur or rub. Abdomen: Soft. No mass. No tenderness. No hernia. Normal bowel sounds.  Well healed lower abdominal scar.  No localized abdominal tenderness or mass.. Rectal: Peri-anal disease on both sides of the rectum. Several areas on both sides of anus draining pus.  This is almost a "watering pot" appearance to the skin around the anus.  The area hurting her the worst is on the right lateral rectum.  Procedure:  Her right buttocks was prepped with betadine.  I infiltrated about 8 cc of 1% xylocaine.  I made a 2 cm incision in a right lateral abscess.  I cored out a 1 cm area of skin.  There was not much drainage, most of the abscess had drained on its own before coming  to the office.  DATA REVIEWED: Reviewed what was in Epic  Ovidio Kin, MD, FACS Office:  [phone]

## 2012-03-31 ENCOUNTER — Ambulatory Visit
Admission: RE | Admit: 2012-03-31 | Discharge: 2012-03-31 | Disposition: A | Discharge status: Home / Self Care | Payer: BC Managed Care – PPO | Source: Ambulatory Visit | Attending: Gastroenterology | Admitting: Gastroenterology

## 2012-03-31 ENCOUNTER — Encounter (INDEPENDENT_AMBULATORY_CARE_PROVIDER_SITE_OTHER): Payer: Self-pay

## 2012-03-31 DIAGNOSIS — K5090 Crohn's disease, unspecified, without complications: Secondary | ICD-10-CM

## 2012-03-31 NOTE — Progress Notes (Signed)
The patient walked in stating she is in a lot of pain.  She has a perianal abscess that she was seen for on July 1.  She saw Dr Ezzard Standing and he did not give her any pain med.  She has been taking Percocet that she had on hand from an ankle fracture.  That helped her pain.  She is requesting a RX.  She also needs a work note to be out until Tuesday.  I asked Dr Gerrit Friends to write a script and he wrote Percocet 5/325 #30 1-2 po q4 prn pain.  I typed a return to work note for her to go back on July 9.

## 2012-05-12 ENCOUNTER — Ambulatory Visit (INDEPENDENT_AMBULATORY_CARE_PROVIDER_SITE_OTHER): Payer: BC Managed Care – PPO | Admitting: Obstetrics & Gynecology

## 2012-05-12 ENCOUNTER — Encounter: Payer: Self-pay | Admitting: Obstetrics & Gynecology

## 2012-05-12 VITALS — BP 119/79 | HR 80 | Temp 99.1°F | Ht 65.75 in | Wt 176.8 lb

## 2012-05-12 DIAGNOSIS — L293 Anogenital pruritus, unspecified: Secondary | ICD-10-CM

## 2012-05-12 DIAGNOSIS — R102 Pelvic and perineal pain: Secondary | ICD-10-CM

## 2012-05-12 DIAGNOSIS — N949 Unspecified condition associated with female genital organs and menstrual cycle: Secondary | ICD-10-CM

## 2012-05-12 DIAGNOSIS — N898 Other specified noninflammatory disorders of vagina: Secondary | ICD-10-CM

## 2012-05-12 DIAGNOSIS — Z01419 Encounter for gynecological examination (general) (routine) without abnormal findings: Secondary | ICD-10-CM

## 2012-05-12 NOTE — Progress Notes (Signed)
Patient ID: Sharon [name], female   DOB: 01-Apr-1981, 31 y.o.   MRN: [id number]  Chief Complaint  Patient presents with  . Gynecologic Exam    annual; white discharge, no odor, irritation on labia  . Ovarian Cyst    left side    HPI Sharon [name] is a 31 y.o. female.  Pt presents for Annual GYN exam.  c/o pain in LLQ pain.  Told that she had an ovarian cyst several years ago.     HPI  Past Medical History  Diagnosis Date  . Crohn's disease   . Perirectal abscess   . Abnormal Pap smear   . Infertility     Past Surgical History  Procedure Date  . Oophorectomy   . Hernia repair   . Laparoscopic lysis intestinal adhesions   . Bladder perforation   . Bowel resection     History reviewed. No pertinent family history.  Social History History  Substance Use Topics  . Smoking status: Never Smoker   . Smokeless tobacco: Not on file  . Alcohol Use: No    No Known Allergies  Current Outpatient Prescriptions  Medication Sig Dispense Refill  . amoxicillin-clavulanate (AUGMENTIN) 875-125 MG per tablet Take 1 tablet by mouth 2 (two) times daily.        Review of Systems Review of Systems  Blood pressure 119/79, pulse 80, temperature 99.1 F (37.3 C), temperature source Oral, height 5' 5.75" (1.67 m), weight 176 lb 12.8 oz (80.196 kg), last menstrual period 04/18/2012.  Physical Exam Physical Exam Pt in NAD Lungs: CTA CV: RRR Abd: well healed vertical incision, soft nontender, nondistended GU: EGBUS: no lesions Vagina: no blood in vault; no abnl discharge; no lesions noted  Cervix: no lesion; no mucopurulent d/c Uterus: small, mobile Adnexa: no palpable masses; tender to palpation on left side.     Data Reviewed Entire chart  Assessment      Pelvic pain: Unsure if related to Chron's or GYN pathology Annual GYN exam      Plan    Pelvic sono F/u PAP, Cx, HPV F/u 1 year or sooner prn       [name], Sharon [name] 05/12/2012, 9:18 AM

## 2012-05-12 NOTE — Patient Instructions (Addendum)

## 2012-05-15 ENCOUNTER — Telehealth: Payer: Self-pay | Admitting: Medical

## 2012-05-15 DIAGNOSIS — N898 Other specified noninflammatory disorders of vagina: Secondary | ICD-10-CM

## 2012-05-15 NOTE — Telephone Encounter (Signed)
Reviewed patient chart. She had pap smear done on Friday. Results are not back yet. LM for patient to return call to clinic to discuss reason for calling.

## 2012-05-15 NOTE — Telephone Encounter (Signed)
Patient called stating she was seen in clinic last Friday and had tests done and would like results.

## 2012-05-16 MED ORDER — FLUCONAZOLE 150 MG PO TABS: 150.0000 mg | ORAL_TABLET | Freq: Once | ORAL | Status: AC

## 2012-05-16 NOTE — Telephone Encounter (Signed)
Returned call to patient about test results. Stated that we did not have pap results back yet and that if there was a need for treatment she would be called otherwise she would receive a letter stating that her results were normal. Patient states that other testing should have been performed at her request at last visit including, GC/CT, HPV and Yeast. I told the patient that GC/CT and HPV results if done would result with the pap smear, but that a wet prep specific for yeast was not completed by the provider. The patient states that she has had yeast infections in the past and feels as though that is the source of her current discharge. She also complains of vaginal itching. Per protocol, I have sent a Rx for diflucan to her chosen pharmacy and informed the patient that it should be available later today. I discussed with the patient that the provider's note did not mention a discharge consistent with yeast, so if the diflucan did not clear up the discharge then she should call the clinic back for further discussion. Patient voiced understanding.

## 2012-05-16 NOTE — Telephone Encounter (Signed)
Patient returned call to clinic wanting test results

## 2012-05-19 ENCOUNTER — Ambulatory Visit (HOSPITAL_COMMUNITY)
Admission: RE | Admit: 2012-05-19 | Discharge: 2012-05-19 | Disposition: A | Discharge status: Home / Self Care | Payer: BC Managed Care – PPO | Source: Ambulatory Visit | Attending: Obstetrics & Gynecology | Admitting: Obstetrics & Gynecology

## 2012-05-19 DIAGNOSIS — N949 Unspecified condition associated with female genital organs and menstrual cycle: Secondary | ICD-10-CM | POA: Insufficient documentation

## 2012-05-19 DIAGNOSIS — R1032 Left lower quadrant pain: Secondary | ICD-10-CM | POA: Insufficient documentation

## 2012-05-19 DIAGNOSIS — R102 Pelvic and perineal pain: Secondary | ICD-10-CM

## 2012-05-19 DIAGNOSIS — N83209 Unspecified ovarian cyst, unspecified side: Secondary | ICD-10-CM | POA: Insufficient documentation

## 2012-05-23 ENCOUNTER — Telehealth: Payer: Self-pay | Admitting: Medical

## 2012-05-23 NOTE — Telephone Encounter (Signed)
Spoke with patient regarding [place] results. Results had been reviewed by Dr. Erin Fulling. [place] results show probably left ovarian hemorrhagic cyst. Recommendation was for follow-up in 6-8 weeks. Patient has upcoming appointment for follow-up scheduled already in the clinic and plans to keep this appointment to discuss options for future management. Patient also requested results of the pap smear. Pap smear was negative, GC/CT were both negative, HPV was negative and yeast panel was negative. Patient voices understanding of these results. She was treated for yeast previously based on her symptoms and feels that her condition has improved. She did not want to discuss the nature of her discharge currently as she was not alone at home today. The patient voices no other questions or concerns at this time.

## 2012-05-23 NOTE — Telephone Encounter (Signed)
Patient called wanting [place] results from US done on Friday.

## 2012-06-09 ENCOUNTER — Ambulatory Visit: Payer: BC Managed Care – PPO | Admitting: Advanced Practice Midwife

## 2012-12-09 IMAGING — US US TRANSVAGINAL NON-OB
1 series · 13 of 25 positions shown · non-contrast
Comparison: CT 12/17/2004

CLINICAL DATA: Left lower quadrant pelvic pain.  Prior right
oophorectomy.



[Series 1: us transvaginal non-ob · 13 of 55 slices shown]
[im 1/55]
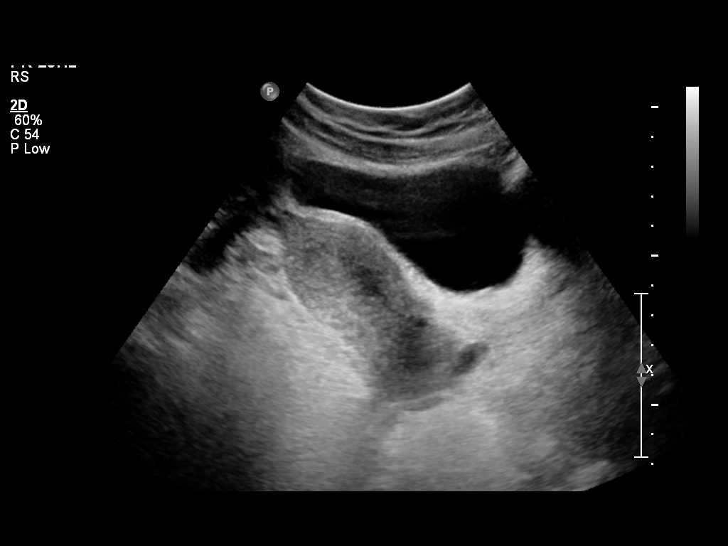
[im 5/55]
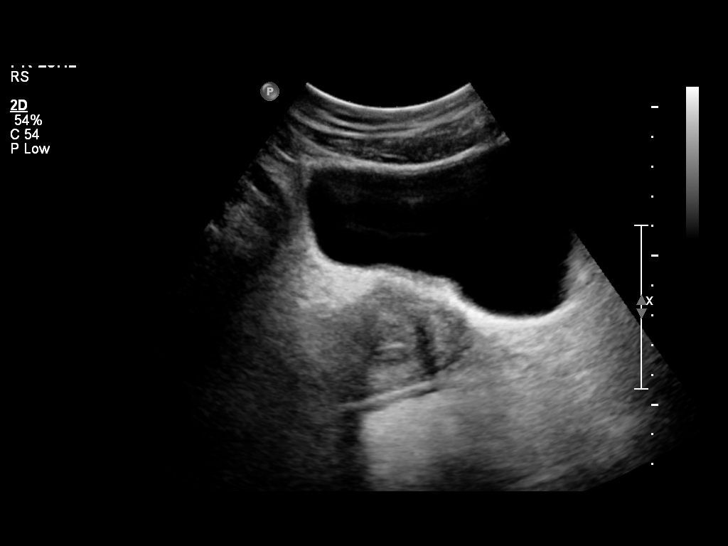
[im 10/55]
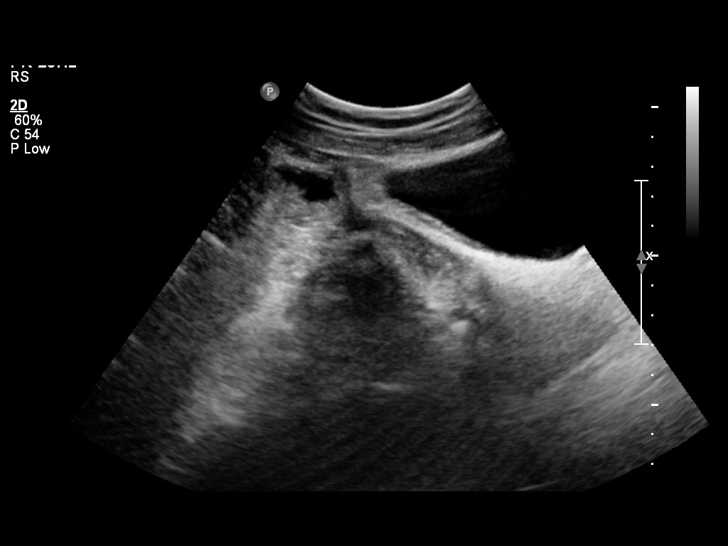
[im 14/55]
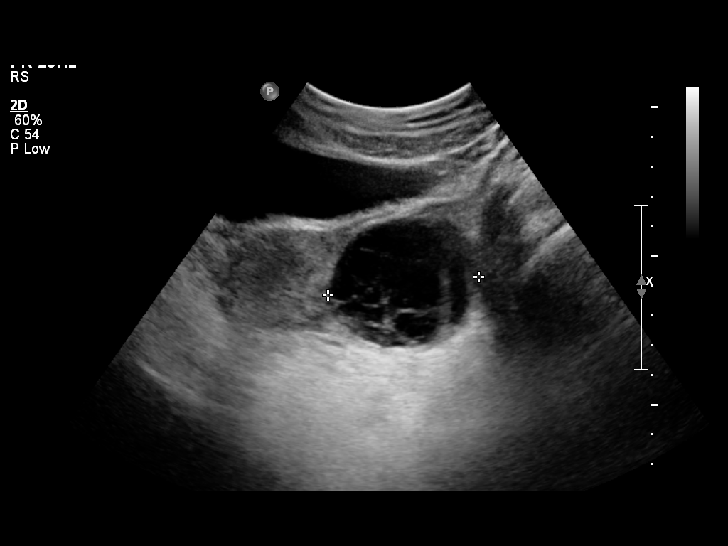
[im 19/55]
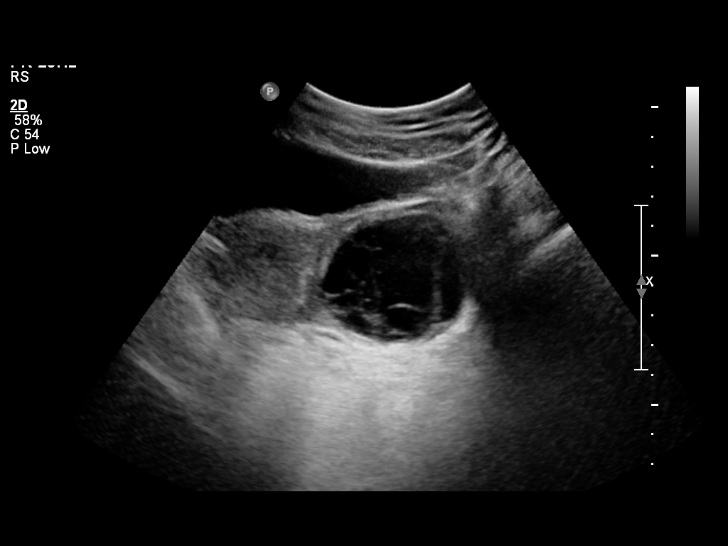
[im 23/55]
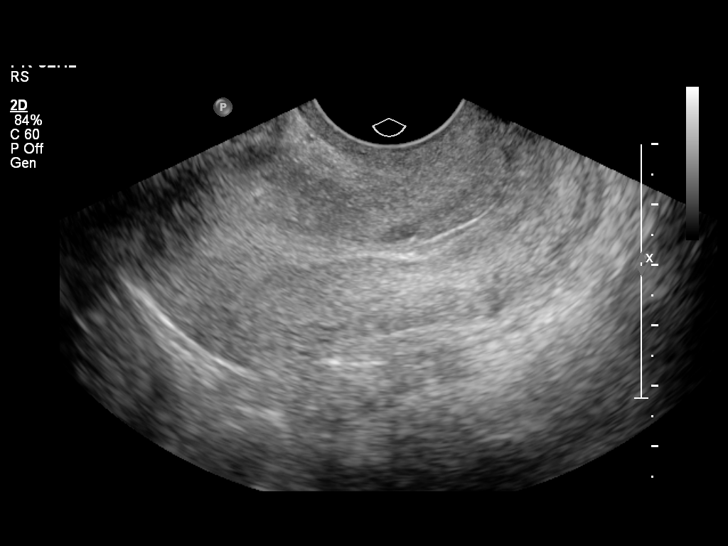
[im 28/55]
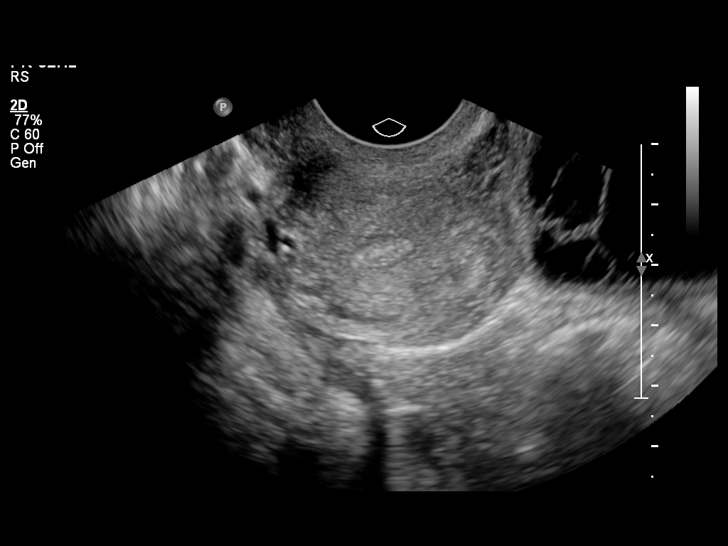
[im 32/55]
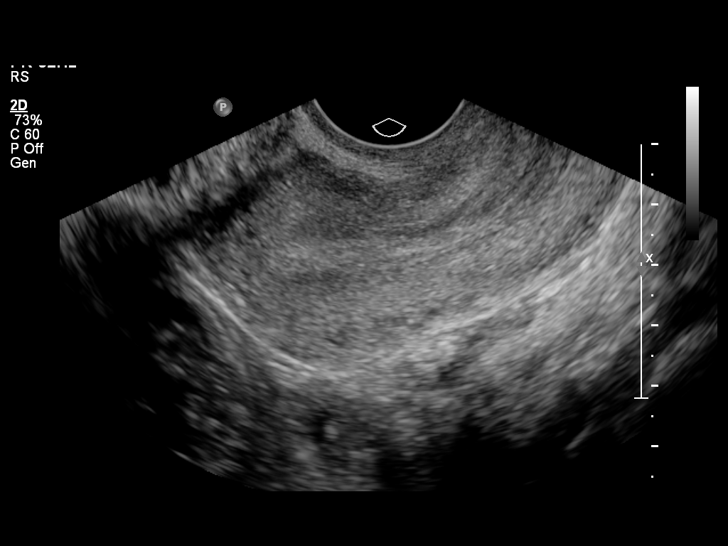
[im 37/55]
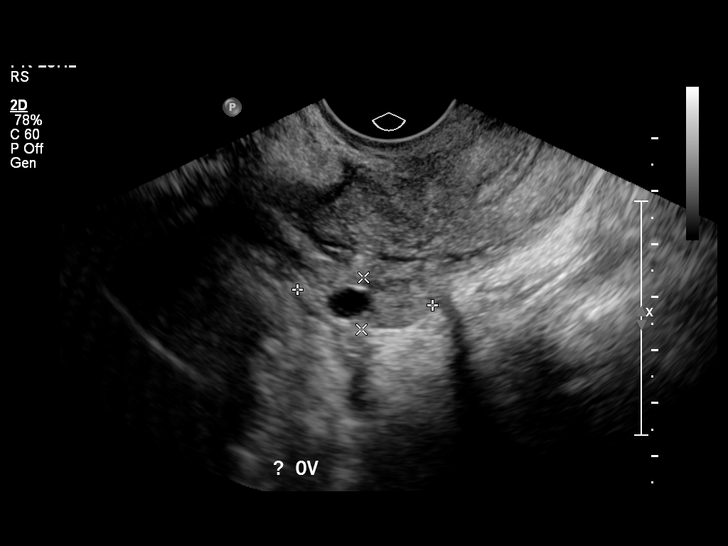
[im 41/55]
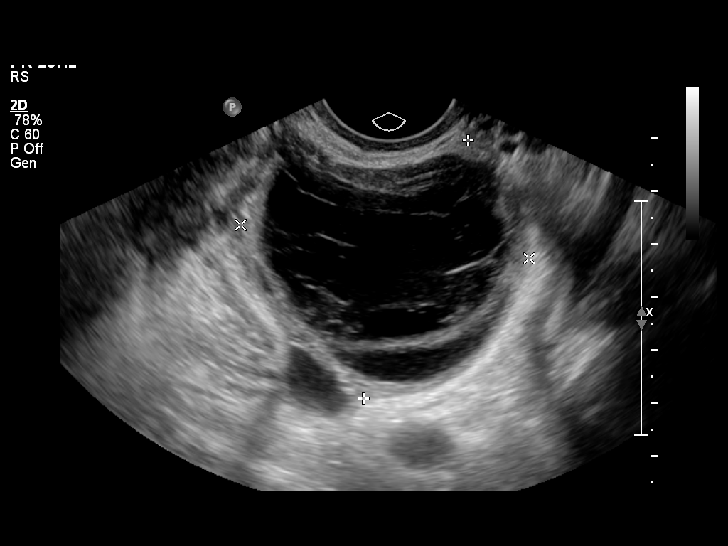
[im 46/55]
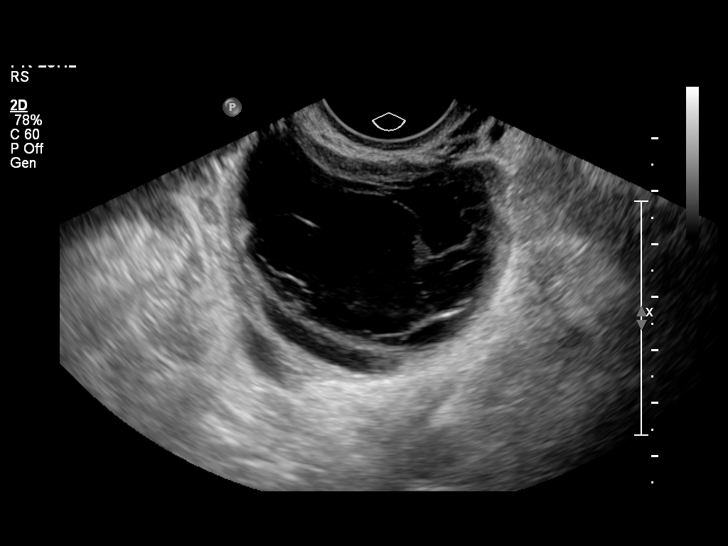
[im 50/55]
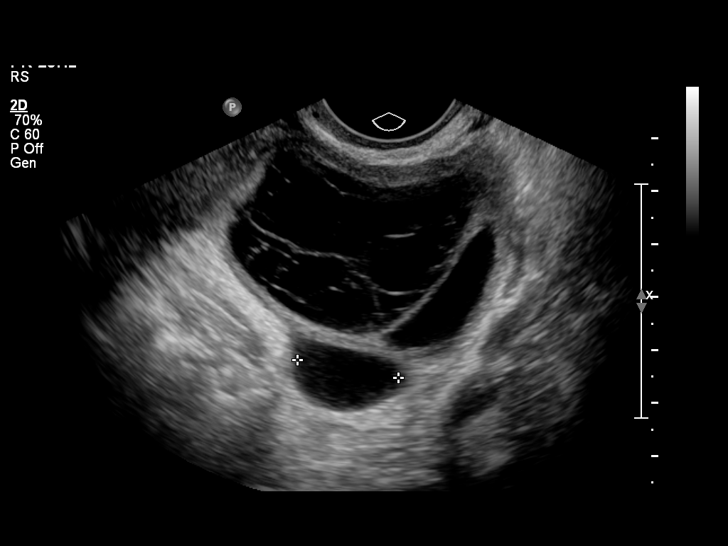
[im 55/55]
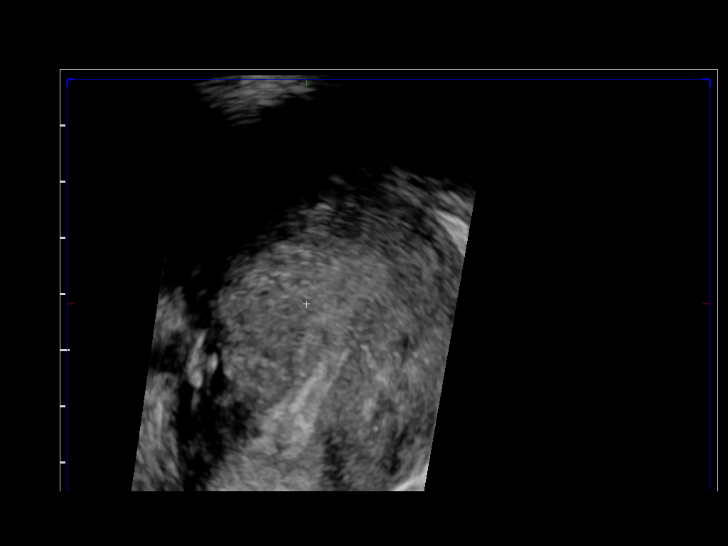

[13 of 25 positions shown; findings below may reference images not displayed]

FINDINGS: Uterus: Anteverted, anteflexed.  7.5 x 5.0 x 3.6 cm.  No focal
abnormality.

Endometrium: 6 mm.  Borderline trilaminar in appearance of focal
abnormality.

Right ovary:  Ovoid soft tissue in the right adnexa with follicle
is likely an ovarian remnant, measuring 2.6 x 1.8 x 0.9 cm.

Left ovary: 5.3 x 5.6 x 5.5 cm.  Cyst with Charlee, weblike reticular
internal echoes measures 5.2 x 4.7 x 3.4 cm.

Other findings: No free fluid
IMPRESSION: Probable left ovarian hemorrhagic cyst.  Follow-up pelvic
ultrasound is recommended in 6-8 weeks during the week following
the patient's menses to document resolution.  This may account for
the history of left lower quadrant pelvic pain..

## 2013-04-24 ENCOUNTER — Telehealth: Payer: Self-pay | Admitting: *Deleted

## 2013-04-24 NOTE — Telephone Encounter (Signed)
Patient left a message that she would like a referral.

## 2013-04-26 NOTE — Telephone Encounter (Signed)
Called phone number and unable to leave message- heard message " the person you are calling is not accepting calls at this time"

## 2013-04-27 NOTE — Telephone Encounter (Signed)
Called again unable to leave message, Will readdress if patient calls again.

## 2013-06-14 ENCOUNTER — Telehealth: Payer: Self-pay | Admitting: General Practice

## 2013-06-14 NOTE — Telephone Encounter (Signed)
Patient called and left message stating she would like a referral to the fertility center

## 2013-06-19 NOTE — Telephone Encounter (Signed)
Called patient. Phone does not ring just goes to message that says voicemail not set up.

## 2014-04-09 ENCOUNTER — Other Ambulatory Visit (HOSPITAL_COMMUNITY): Payer: Self-pay | Admitting: Obstetrics and Gynecology

## 2014-04-09 DIAGNOSIS — N979 Female infertility, unspecified: Secondary | ICD-10-CM

## 2014-04-15 ENCOUNTER — Other Ambulatory Visit (HOSPITAL_COMMUNITY): Payer: Self-pay | Admitting: Obstetrics and Gynecology

## 2014-04-15 ENCOUNTER — Ambulatory Visit (HOSPITAL_COMMUNITY)
Admission: RE | Admit: 2014-04-15 | Discharge: 2014-04-15 | Disposition: A | Discharge status: Home / Self Care | Payer: BC Managed Care – PPO | Source: Ambulatory Visit | Attending: Obstetrics and Gynecology | Admitting: Obstetrics and Gynecology

## 2014-04-15 DIAGNOSIS — N979 Female infertility, unspecified: Secondary | ICD-10-CM

## 2014-04-15 MED ORDER — IOHEXOL 300 MG/ML  SOLN
20.0000 mL | Freq: Once | INTRAMUSCULAR | Status: AC | PRN
  Administered 2014-04-15: 20 mL

## 2014-04-17 ENCOUNTER — Ambulatory Visit (HOSPITAL_COMMUNITY)
Admission: RE | Admit: 2014-04-17 | Discharge: 2014-04-17 | Disposition: A | Discharge status: Home / Self Care | Payer: BC Managed Care – PPO | Source: Ambulatory Visit | Attending: Obstetrics and Gynecology | Admitting: Obstetrics and Gynecology

## 2014-04-17 DIAGNOSIS — N979 Female infertility, unspecified: Secondary | ICD-10-CM

## 2014-04-17 MED ORDER — IOHEXOL 300 MG/ML  SOLN
20.0000 mL | Freq: Once | INTRAMUSCULAR | Status: AC | PRN
  Administered 2014-04-17: 20 mL

## 2014-04-22 ENCOUNTER — Other Ambulatory Visit (HOSPITAL_COMMUNITY): Payer: Self-pay | Admitting: Obstetrics and Gynecology

## 2014-04-22 DIAGNOSIS — N83209 Unspecified ovarian cyst, unspecified side: Secondary | ICD-10-CM

## 2014-04-25 ENCOUNTER — Other Ambulatory Visit (HOSPITAL_COMMUNITY): Payer: BC Managed Care – PPO

## 2014-09-10 ENCOUNTER — Encounter (HOSPITAL_BASED_OUTPATIENT_CLINIC_OR_DEPARTMENT_OTHER): Payer: Self-pay | Admitting: *Deleted

## 2014-09-11 ENCOUNTER — Encounter (HOSPITAL_BASED_OUTPATIENT_CLINIC_OR_DEPARTMENT_OTHER): Admission: RE | Payer: Self-pay | Source: Ambulatory Visit

## 2014-09-11 ENCOUNTER — Ambulatory Visit (HOSPITAL_BASED_OUTPATIENT_CLINIC_OR_DEPARTMENT_OTHER)
Admission: RE | Admit: 2014-09-11 | Payer: BC Managed Care – PPO | Source: Ambulatory Visit | Admitting: Obstetrics and Gynecology

## 2014-09-11 HISTORY — DX: Female pelvic peritoneal adhesions (postinfective): N73.6

## 2014-09-11 HISTORY — DX: Personal history of other diseases of the female genital tract: Z87.42

## 2014-09-11 HISTORY — DX: Personal history of other infectious and parasitic diseases: Z86.19

## 2014-09-11 SURGERY — LAPAROSCOPY OPERATIVE
Anesthesia: General

## 2015-01-15 ENCOUNTER — Other Ambulatory Visit: Payer: Self-pay | Admitting: Gastroenterology

## 2015-01-15 DIAGNOSIS — K50019 Crohn's disease of small intestine with unspecified complications: Secondary | ICD-10-CM

## 2015-01-15 DIAGNOSIS — R109 Unspecified abdominal pain: Secondary | ICD-10-CM

## 2015-01-16 ENCOUNTER — Ambulatory Visit
Admission: RE | Admit: 2015-01-16 | Discharge: 2015-01-16 | Disposition: A | Discharge status: Home / Self Care | Payer: BLUE CROSS/BLUE SHIELD | Source: Ambulatory Visit | Attending: Gastroenterology | Admitting: Gastroenterology

## 2015-01-16 DIAGNOSIS — R109 Unspecified abdominal pain: Secondary | ICD-10-CM

## 2015-01-16 DIAGNOSIS — K50019 Crohn's disease of small intestine with unspecified complications: Secondary | ICD-10-CM

## 2015-01-16 MED ORDER — IOPAMIDOL (ISOVUE-300) INJECTION 61%
100.0000 mL | Freq: Once | INTRAVENOUS | Status: AC | PRN
  Administered 2015-01-16: 100 mL via INTRAVENOUS

## 2015-06-03 ENCOUNTER — Other Ambulatory Visit: Payer: Self-pay | Admitting: Internal Medicine

## 2015-07-11 ENCOUNTER — Telehealth: Payer: Self-pay | Admitting: Gastroenterology

## 2015-07-17 NOTE — Telephone Encounter (Signed)
Dr Christella Hartigan declined. Placed on Dr Scheryl Darter desk now.

## 2015-08-04 NOTE — Telephone Encounter (Signed)
Dr. Lavon Paganini has declined to accept patient. Left message for patient to return my call.

## 2015-08-04 NOTE — Telephone Encounter (Signed)
Patient returned phone call. I informed her of this.

## 2015-08-29 ENCOUNTER — Other Ambulatory Visit: Payer: Self-pay | Admitting: Internal Medicine

## 2015-08-29 ENCOUNTER — Other Ambulatory Visit: Payer: Self-pay | Admitting: Physician Assistant

## 2015-08-29 DIAGNOSIS — K50012 Crohn's disease of small intestine with intestinal obstruction: Secondary | ICD-10-CM

## 2015-09-03 ENCOUNTER — Ambulatory Visit
Admission: RE | Admit: 2015-09-03 | Discharge: 2015-09-03 | Disposition: A | Discharge status: Home / Self Care | Payer: 59 | Source: Ambulatory Visit | Attending: Internal Medicine | Admitting: Internal Medicine

## 2015-09-03 DIAGNOSIS — K50012 Crohn's disease of small intestine with intestinal obstruction: Secondary | ICD-10-CM

## 2015-09-03 MED ORDER — GADOBENATE DIMEGLUMINE 529 MG/ML IV SOLN
13.0000 mL | Freq: Once | INTRAVENOUS | Status: AC | PRN
  Administered 2015-09-03: 13 mL via INTRAVENOUS

## 2015-09-25 ENCOUNTER — Emergency Department (HOSPITAL_COMMUNITY)
Admission: EM | Admit: 2015-09-25 | Discharge: 2015-09-25 | Disposition: A | Discharge status: Home / Self Care | Payer: 59 | Attending: Emergency Medicine | Admitting: Emergency Medicine

## 2015-09-25 DIAGNOSIS — D649 Anemia, unspecified: Secondary | ICD-10-CM | POA: Diagnosis not present

## 2015-09-25 DIAGNOSIS — R109 Unspecified abdominal pain: Secondary | ICD-10-CM | POA: Diagnosis present

## 2015-09-25 DIAGNOSIS — Z87891 Personal history of nicotine dependence: Secondary | ICD-10-CM | POA: Diagnosis not present

## 2015-09-25 DIAGNOSIS — Z8742 Personal history of other diseases of the female genital tract: Secondary | ICD-10-CM | POA: Diagnosis not present

## 2015-09-25 DIAGNOSIS — E876 Hypokalemia: Secondary | ICD-10-CM | POA: Diagnosis not present

## 2015-09-25 DIAGNOSIS — Z8619 Personal history of other infectious and parasitic diseases: Secondary | ICD-10-CM | POA: Diagnosis not present

## 2015-09-25 DIAGNOSIS — K5090 Crohn's disease, unspecified, without complications: Secondary | ICD-10-CM | POA: Insufficient documentation

## 2015-09-25 LAB — COMPREHENSIVE METABOLIC PANEL
ALT: 15 U/L (ref 14–54)
ANION GAP: 7 (ref 5–15)
AST: 12 U/L — AB (ref 15–41)
Albumin: 2.3 g/dL — ABNORMAL LOW (ref 3.5–5.0)
Alkaline Phosphatase: 78 U/L (ref 38–126)
BILIRUBIN TOTAL: 0.8 mg/dL (ref 0.3–1.2)
BUN: 7 mg/dL (ref 6–20)
CHLORIDE: 102 mmol/L (ref 101–111)
CO2: 30 mmol/L (ref 22–32)
Calcium: 8.1 mg/dL — ABNORMAL LOW (ref 8.9–10.3)
Creatinine, Ser: 0.88 mg/dL (ref 0.44–1.00)
Glucose, Bld: 82 mg/dL (ref 65–99)
POTASSIUM: 2.9 mmol/L — AB (ref 3.5–5.1)
Sodium: 139 mmol/L (ref 135–145)
TOTAL PROTEIN: 6.8 g/dL (ref 6.5–8.1)

## 2015-09-25 LAB — CBC
HEMATOCRIT: 26.8 % — AB (ref 36.0–46.0)
HEMOGLOBIN: 8.9 g/dL — AB (ref 12.0–15.0)
MCH: 29.5 pg (ref 26.0–34.0)
MCHC: 33.2 g/dL (ref 30.0–36.0)
MCV: 88.7 fL (ref 78.0–100.0)
Platelets: 391 10*3/uL (ref 150–400)
RBC: 3.02 MIL/uL — AB (ref 3.87–5.11)
RDW: 12.4 % (ref 11.5–15.5)
WBC: 5.4 10*3/uL (ref 4.0–10.5)

## 2015-09-25 LAB — ABO/RH: ABO/RH(D): A POS

## 2015-09-25 LAB — POC OCCULT BLOOD, ED: FECAL OCCULT BLD: POSITIVE — AB

## 2015-09-25 LAB — I-STAT BETA HCG BLOOD, ED (MC, WL, AP ONLY)

## 2015-09-25 LAB — TYPE AND SCREEN
ABO/RH(D): A POS
ANTIBODY SCREEN: NEGATIVE

## 2015-09-25 MED ORDER — OXYCODONE-ACETAMINOPHEN 5-325 MG PO TABS: 1.0000 | ORAL_TABLET | ORAL | Status: DC | PRN

## 2015-09-25 MED ORDER — LOPERAMIDE HCL 2 MG PO CAPS: 2.0000 mg | ORAL_CAPSULE | Freq: Four times a day (QID) | ORAL | Status: DC | PRN

## 2015-09-25 MED ORDER — MORPHINE SULFATE (PF) 4 MG/ML IV SOLN
6.0000 mg | Freq: Once | INTRAVENOUS | Status: AC
  Administered 2015-09-25: 6 mg via INTRAVENOUS
  Filled 2015-09-25: qty 2

## 2015-09-25 MED ORDER — HYDROMORPHONE HCL 1 MG/ML IJ SOLN: 1.0000 mg | Freq: Once | INTRAMUSCULAR | Status: DC

## 2015-09-25 MED ORDER — PREDNISONE 20 MG PO TABS
60.0000 mg | ORAL_TABLET | Freq: Once | ORAL | Status: AC
  Administered 2015-09-25: 60 mg via ORAL
  Filled 2015-09-25: qty 3

## 2015-09-25 MED ORDER — POTASSIUM CHLORIDE CRYS ER 20 MEQ PO TBCR
40.0000 meq | EXTENDED_RELEASE_TABLET | Freq: Once | ORAL | Status: AC
  Administered 2015-09-25: 40 meq via ORAL
  Filled 2015-09-25: qty 2

## 2015-09-25 MED ORDER — POTASSIUM CHLORIDE CRYS ER 20 MEQ PO TBCR
60.0000 meq | EXTENDED_RELEASE_TABLET | Freq: Once | ORAL | Status: AC
  Administered 2015-09-25: 60 meq via ORAL
  Filled 2015-09-25: qty 3

## 2015-09-25 MED ORDER — LACTATED RINGERS IV BOLUS (SEPSIS)
1000.0000 mL | Freq: Once | INTRAVENOUS | Status: AC
Start: 2015-09-25 — End: 2015-09-25
  Administered 2015-09-25: 1000 mL via INTRAVENOUS

## 2015-09-25 MED ORDER — MAGNESIUM OXIDE 400 (241.3 MG) MG PO TABS
400.0000 mg | ORAL_TABLET | Freq: Every day | ORAL | Status: DC
  Administered 2015-09-25: 400 mg via ORAL
  Filled 2015-09-25: qty 1

## 2015-09-25 MED ORDER — HYDROCODONE-ACETAMINOPHEN 5-325 MG PO TABS
1.0000 | ORAL_TABLET | Freq: Once | ORAL | Status: AC
  Administered 2015-09-25: 1 via ORAL
  Filled 2015-09-25: qty 1

## 2015-09-25 MED ORDER — PREDNISONE 10 MG PO TABS: 60.0000 mg | ORAL_TABLET | Freq: Every day | ORAL | Status: DC

## 2015-09-25 NOTE — ED Provider Notes (Signed)
CSN: [id number]     Arrival date & time 09/25/15  1236 History   First MD Initiated Contact with Patient 09/25/15 1722     Chief Complaint  Patient presents with  . Anemia     (Consider location/radiation/quality/duration/timing/severity/associated sxs/prior Treatment) HPI Comments: Pt comes in with cc of abdominal pain and low Hb. Pt had gone to an Urgent Care few days ago, she was informed that her Hb was 7.4 - and that she should go to the Er for transfusion. Pt has been eating well since then and doing OK, until today -when she started having abd pain and feeling week -so she came to the ER for transfusion and abd pain. Pain is generalized, non radiating and worse with food. PT also reports 10 loose BM/day for the past few days. She has no fevers, no emesis. She has been taking tylenol with no relief today. PT is to see her GI at [facility] in Jan.  Pt denies any syncope, dizziness, chest pain ,dib. Her Hb is 8.9 today. She has no bloody or tarry stools.   ROS 10 Systems reviewed and are negative for acute change except as noted in the HPI.     Patient is a 34 y.o. female presenting with anemia. The history is provided by the patient.  Anemia    Past Medical History  Diagnosis Date  . Crohn's disease     dx 42  . Perirectal abscess   . Infertility   . History of abnormal cervical Pap smear   . Pelvic peritoneal adhesions, female   . History of genital warts    Past Surgical History  Procedure Laterality Date  . Laparoscopic lysis intestinal adhesions    . Bladder perforation    . Right hemicolectomy/ anastomosis/  repair abdominal wall hernia/  right salpinoophorectomy  01/ 2005    [facility]'S  . Colonoscopy  last one 02-25-2011   No family history on file. Social History  Substance Use Topics  . Smoking status: Former Smoker    Quit date: 07/12/2011  . Smokeless tobacco: Not on file  . Alcohol Use: No   OB History    No data available     Review of  Systems    Allergies  Review of patient's allergies indicates no known allergies.  Home Medications   Prior to Admission medications   Medication Sig Start Date End Date Taking? Authorizing Provider  Adalimumab (HUMIRA) 40 MG/0.8ML PSKT Inject 40 mg into the skin every Thursday. 09/05/15 12/04/15 Yes Historical Provider, MD  loperamide (IMODIUM) 2 MG capsule Take 1 capsule (2 mg total) by mouth 4 (four) times daily as needed for diarrhea or loose stools. 09/25/15   Derwood Kaplan, MD  oxyCODONE-acetaminophen (PERCOCET/ROXICET) 5-325 MG tablet Take 1 tablet by mouth every 4 (four) hours as needed for severe pain. 09/25/15   Derwood Kaplan, MD  predniSONE (DELTASONE) 10 MG tablet Take 6 tablets (60 mg total) by mouth daily. 09/25/15   Farhana Fellows, MD   BP 109/66 mmHg  Pulse 92  Temp(Src) 98.4 F (36.9 C) (Oral)  Resp 16  Wt 141 lb 3.2 oz (64.048 kg)  SpO2 100% Physical Exam  Constitutional: She is oriented to person, place, and time. She appears well-developed and well-nourished.  HENT:  Head: Normocephalic and atraumatic.  Eyes: EOM are normal. Pupils are equal, round, and reactive to light.  Neck: Neck supple.  Cardiovascular: Normal rate, regular rhythm and normal heart sounds.   No murmur heard. Pulmonary/Chest: Effort normal.  No respiratory distress.  Abdominal: Soft. She exhibits no distension. There is tenderness. There is no rebound and no guarding.  Generalized tenderness, worst on the right lateral side  Neurological: She is alert and oriented to person, place, and time.  Skin: Skin is warm and dry.  Nursing note and vitals reviewed.   ED Course  Procedures (including critical care time) Labs Review Labs Reviewed  COMPREHENSIVE METABOLIC PANEL - Abnormal; Notable for the following:    Potassium 2.9 (*)    Calcium 8.1 (*)    Albumin 2.3 (*)    AST 12 (*)    All other components within normal limits  CBC - Abnormal; Notable for the following:    RBC 3.02 (*)     Hemoglobin 8.9 (*)    HCT 26.8 (*)    All other components within normal limits  POC OCCULT BLOOD, ED - Abnormal; Notable for the following:    Fecal Occult Bld POSITIVE (*)    All other components within normal limits  I-STAT BETA HCG BLOOD, ED ([facility], [facility], [facility] ONLY)  TYPE AND SCREEN  ABO/RH    Imaging Review No results found. I have personally reviewed and evaluated these images and lab results as part of my medical decision-making.   EKG Interpretation None      MDM   Final diagnoses:  Hypokalemia  Exacerbation of Crohn's disease, without complications (HCC)    Pt comes in with cc of abd pain. She has hx of Crohns, on Humira. She reports hx of surgical resection when in her teens (diagnsoed at that time). She has had structures due to Crohns. Her pain is diffuse, and the exam is non peritoneal.  CT scan from April reviewed, and she reports that her symptoms are similar - at that time she had crohns flare up and she got better with prednisone. She has no elevated WC, and no fevers - thus eith the non peritoneal abd pain exam, ot is comfortable with wait and see approach and empiric tx with steroids. She has anemia, but the Hb isnt low enough to merit transfusion and she is not symptomatic. K is low, will replace. Likely from diarrhea.    Derwood Kaplan, MD 09/26/15 [phone]

## 2015-09-25 NOTE — Discharge Instructions (Signed)
We think you have a crohns flair up - which is causing the abdominal pain and diarrhea. Take the steroids. Take the pain meds and diarrhea meds as needed. See your GI doctor as soon as possible.  Please return to the ER if your symptoms worsen; you have increased pain, fevers, chills, bloody stools, inability to keep any medications down.   Crohn Disease Crohn disease is a long-lasting (chronic) disease that affects your gastrointestinal (GI) tract. It often causes irritation and swelling (inflammation) in your small intestine and the beginning of your large intestine. However, it can affect any part of your GI tract. Crohn disease is part of a group of illnesses that are known as inflammatory bowel disease (IBD). Crohn disease may start slowly and get worse over time. Symptoms may come and go. They may also disappear for months or even years at a time (remission). CAUSES The exact cause of Crohn disease is not known. It may be a response that causes your body's defense system (immune system) to mistakenly attack healthy cells and tissues (autoimmune response). Your genes and your environment may also play a role. RISK FACTORS You may be at greater risk for Crohn disease if you:  Have other family members with Crohn disease or another IBD.  Use any tobacco products, including cigarettes, chewing tobacco, or electronic cigarettes.  Are in your 62s.  Have [place] European ancestry. SIGNS AND SYMPTOMS The main signs and symptoms of Crohn disease involve your GI tract. These include:  Diarrhea.  Rectal bleeding.  An urgent need to move your bowels.  The feeling that you are not finished having a bowel movement.  Abdominal pain or cramping.  Constipation. General signs and symptoms of Crohn disease may also include:  Unexplained weight loss.  Fatigue.  Fever.  Nausea.  Loss of appetite.  Joint pain  Changes in vision.  Red bumps on your skin. DIAGNOSIS Your health  care provider may suspect Crohn disease based on your symptoms and your medical history. Your health care provider will do a physical exam. You may need to see a health care provider who specializes in diseases of the digestive tract (gastroenterologist). You may also have tests to help your health care providers make a diagnosis. These may include:  Blood tests.  Stool sample tests.  Imaging tests, such as X-rays and CT scans.  Tests to examine the inside of your intestines using a long, flexible tube that has a light and a camera on the end (endoscopy or colonoscopy).  A procedure to take tissue samples from inside your bowel (biopsy) to be examined under a microscope. TREATMENT  There is no cure for Crohn disease. Treatment will focus on managing your symptoms. Crohn disease affects each person differently. Your treatment may include:  Resting your bowels. Drinking only clear liquids or getting nutrition through an IV for a period of time gives your bowels a chance to heal because they are not passing stools.  Medicines. These may be used alone or in combination (combination therapy). These may include antibiotic medicines. You may be given medicines that help to:  Reduce inflammation.  Control your immune system activity.  Fight infections.  Relieve cramps and prevent diarrhea.  Control your pain.  Surgery. You may need surgery if:  Medicines and other treatments are no longer working.  You develop complications from severe Crohn disease.  A section of your intestine becomes so damaged that it needs to be removed. HOME CARE INSTRUCTIONS  Take medicines only as  directed by your health care provider.  If you were prescribed an antibiotic medicine, finish it all even if you start to feel better.  Keep all follow-up visits as directed by your health care provider. This is important.  Talk with your health care provider about changing your diet. This may help your symptoms.  Your health care provide may recommend changes, such as:  Drinking more fluids.  Avoiding milk and other foods that contain lactose.  Eating a low-fat diet.  Avoiding high-fiber foods, such as popcorn and nuts.  Avoiding carbonated beverages, such as soda.  Eating smaller meals more often rather than eating large meals.  Keeping a food diary to identify foods that make your symptoms better or worse.  Do not use any tobacco products, including cigarettes, chewing tobacco, or electronic cigarettes. If you need help quitting, ask your health care provider.  Limit alcohol intake to no more than 1 drink per day for nonpregnant women and 2 drinks per day for men. One drink equals 12 ounces of beer, 5 ounces of wine, or 1 ounces of hard liquor.  Exercise daily or as directed by your health care provider. SEEK MEDICAL CARE IF:  You have diarrhea, abdominal cramps, and other gastrointestinal problems that are present almost all of the time.  Your symptoms do not improve with treatment.  You continue to lose weight.  You develop a rash or sores on your skin.  You develop eye problems.  You have a fever.   Your symptoms get worse.  You develop new symptoms. SEEK IMMEDIATE MEDICAL CARE IF:  You have bloody diarrhea.  You develop severe abdominal pain.  You cannot pass stools.   This information is not intended to replace advice given to you by your health care provider. Make sure you discuss any questions you have with your health care provider.   Document Released: 06/23/2005 Document Revised: 10/04/2014 Document Reviewed: 05/01/2014 Elsevier Interactive Patient Education 2016 Else[employer] Inc[employer] Clear Liquid Diet A clear liquid diet is a short-term diet that is prescribed to provide the necessary fluid and basic energy you need when you can have nothing else. The clear liquid diet consists of liquids or solids that will become liquid at room temperature. You should be able to  see through the liquid. There are many reasons that you may be restricted to clear liquids, such as:  When you have a sudden-onset (acute) condition that occurs before or after surgery.  To help your body slowly get adjusted to food again after a long period when you were unable to have food.  Replacement of fluids when you have a diarrheal disease.  When you are going to have certain exams, such as a colonoscopy, in which instruments are inserted inside your body to look at parts of your digestive system. WHAT CAN I HAVE? A clear liquid diet does not provide all the nutrients you need. It is important to choose a variety of the following items to get as many nutrients as possible:  Vegetable juices that do not have pulp.  Fruit juices and fruit drinks that do not have pulp.  Coffee (regular or decaffeinated), tea, or soda at the discretion of your health care provider.  Clear bouillon, broth, or strained broth-based soups.  High-protein and flavored gelatins.  Sugar or honey.  Ices or frozen ice pops that do not contain milk. If you are not sure whether you can have certain items, you should ask your health care provider. You may also ask  your health care provider if there are any other clear liquid options.   This information is not intended to replace advice given to you by your health care provider. Make sure you discuss any questions you have with your health care provider.   Document Released: 09/13/2005 Document Revised: 09/18/2013 Document Reviewed: 08/10/2013 Elsevier Interactive Patient Education [employer]

## 2015-09-25 NOTE — ED Notes (Signed)
Pt with Hx of Crohn's reports Hgb 7.8 measured on December 22, sent here for transfusion, feels weak. Denies any signs of GI bleed, dizziness.

## 2016-01-06 ENCOUNTER — Encounter (HOSPITAL_BASED_OUTPATIENT_CLINIC_OR_DEPARTMENT_OTHER): Admission: RE | Payer: Self-pay | Source: Ambulatory Visit

## 2016-01-06 ENCOUNTER — Ambulatory Visit (HOSPITAL_BASED_OUTPATIENT_CLINIC_OR_DEPARTMENT_OTHER): Admission: RE | Admit: 2016-01-06 | Payer: 59 | Source: Ambulatory Visit | Admitting: Obstetrics and Gynecology

## 2016-01-06 SURGERY — LAPAROSCOPY OPERATIVE
Anesthesia: General

## 2017-01-20 ENCOUNTER — Ambulatory Visit (INDEPENDENT_AMBULATORY_CARE_PROVIDER_SITE_OTHER): Payer: Managed Care, Other (non HMO) | Admitting: Family Medicine

## 2017-01-20 VITALS — BP 119/81 | HR 79 | Temp 98.3°F | Resp 18 | Ht 65.75 in

## 2017-01-20 DIAGNOSIS — R220 Localized swelling, mass and lump, head: Secondary | ICD-10-CM | POA: Diagnosis not present

## 2017-01-20 DIAGNOSIS — R221 Localized swelling, mass and lump, neck: Secondary | ICD-10-CM | POA: Diagnosis not present

## 2017-01-20 DIAGNOSIS — Z8709 Personal history of other diseases of the respiratory system: Secondary | ICD-10-CM | POA: Diagnosis not present

## 2017-01-20 DIAGNOSIS — R062 Wheezing: Secondary | ICD-10-CM

## 2017-01-20 DIAGNOSIS — K1120 Sialoadenitis, unspecified: Secondary | ICD-10-CM

## 2017-01-20 MED ORDER — HYDROCODONE-HOMATROPINE 5-1.5 MG/5ML PO SYRP: 5.0000 mL | ORAL_SOLUTION | Freq: Three times a day (TID) | ORAL | 0 refills | Status: DC | PRN

## 2017-01-20 MED ORDER — CLINDAMYCIN HCL 300 MG PO CAPS: 300.0000 mg | ORAL_CAPSULE | Freq: Three times a day (TID) | ORAL | 0 refills | Status: DC

## 2017-01-20 MED ORDER — CEPHALEXIN 500 MG PO CAPS: 500.0000 mg | ORAL_CAPSULE | Freq: Three times a day (TID) | ORAL | 0 refills | Status: DC

## 2017-01-20 MED ORDER — ALBUTEROL SULFATE HFA 108 (90 BASE) MCG/ACT IN AERS: 2.0000 | INHALATION_SPRAY | Freq: Four times a day (QID) | RESPIRATORY_TRACT | 0 refills | Status: DC | PRN

## 2017-01-20 NOTE — Patient Instructions (Addendum)
Take the clindamycin and the Keflex 3 times a day for the next 7 days.  Let me know on Saturday which will be in 48 hours how the antibiotics are working.  If you're still having swelling and pain we may need to send you for a CT scan of your head.  Use the Hycodan cough syrup. You can use this every 8 hours if you need to.  Use the albuterol inhaler every 6 hours for coughing or wheezing. Also use before night.  If you have any drooling, difficulty breathing, difficulty eating or drinking come back immediately or go to the emergency department.    IF you received an x-ray today, you will receive an invoice from [facility]. Please contact [facility] at [phone] with questions or concerns regarding your invoice.   IF you received labwork today, you will receive an invoice from [facility]. Please contact [facility] at [phone] with questions or concerns regarding your invoice.   Our billing staff will not be able to assist you with questions regarding bills from these companies.  You will be contacted with the lab results as soon as they are available. The fastest way to get your results is to activate your My Chart account. Instructions are located on the last page of this paperwork. If you have not heard from [place] regarding the results in 2 weeks, please contact this office.

## 2017-01-20 NOTE — Progress Notes (Signed)
Sharon [name] is a 36 y.o. female who presents to Primary Care at [facility] today for Right jaw pain:  1.  Right jaw pain: Patient with Right jaw pain for about 2 weeks.  She presented to another practice on April 12 and was prescribed Augmentin at that time. A 10 day course total. She states that the pain is somewhat improved but as soon as she finished the Augmentin and returned. She herself has not noticed a difference but states that her husband has noted increased swelling on the right side of her jaw. She has pain when she tries to chew on that side but thinks this is mostly for moving her chart rather than anything hitting her teeth.  She is eating and drinking okay. No nausea/vomiting.  No difficulty with drooling or breathing. No difficulty with swallowing.  She does have a cough for the past 3-4 days with increased sputum production.  She has heard audible wheezing in the evenings when trying to sleep. Also with cough that is worse in the evening. She has a distant history of asthma but does not have an inhaler at home and doesn't use an inhaler for at least a year.  Past medical history: She does have history of Crohn's disease for which she takes daily prednisone 7.5 mg per day. She is also on Stelara for this and follow's with Crohn specialist.    Fam Hx/:  No family history of intestinal disorders, asthma, or sinus problems.  ROS as above.  Pertinently, no chest pain, palpitations, SOB, Fever, Chills, Abd pain, N/V/D.   PMH reviewed. Patient is a nonsmoker.   Past Medical History:  Diagnosis Date  . Crohn's disease (HCC)    dx 1996  . History of abnormal cervical Pap smear   . History of genital warts   . Infertility   . Pelvic peritoneal adhesions, female   . Perirectal abscess    Past Surgical History:  Procedure Laterality Date  . bladder perforation    . COLONOSCOPY  last one 02-25-2011  . LAPAROSCOPIC LYSIS INTESTINAL ADHESIONS    . RIGHT HEMICOLECTOMY/ ANASTOMOSIS/   REPAIR ABDOMINAL WALL HERNIA/  RIGHT SALPINOOPHORECTOMY  01/ 2005   CROHN'S    Medications reviewed. Current Outpatient Prescriptions  Medication Sig Dispense Refill  . amoxicillin-clavulanate (AUGMENTIN) 875-125 MG tablet Take 1 tablet by mouth 2 (two) times daily.    . predniSONE (DELTASONE) 10 MG tablet Take 6 tablets (60 mg total) by mouth daily. 30 tablet 0  . Ustekinumab (STELARA [place]) Inject into the skin.    . Adalimumab (HUMIRA) 40 MG/0.8ML PSKT Inject 40 mg into the skin every Thursday.    . loperamide (IMODIUM) 2 MG capsule Take 1 capsule (2 mg total) by mouth 4 (four) times daily as needed for diarrhea or loose stools. (Patient not taking: Reported on 01/20/2017) 12 capsule 0   No current facility-administered medications for this visit.      Physical Exam:  BP 119/81   Pulse 79   Temp 98.3 F (36.8 C) (Oral)   Resp 18   Ht 5' 5.75" (1.67 m)   LMP 01/14/2017   SpO2 98%  Gen:  Patient sitting on exam table, appears stated age in no acute distress Head: Normocephalic atraumatic Eyes: EOMI, PERRL, sclera and conjunctiva non-erythematous Ears:  Canals clear bilaterally.  TMs pearly gray bilaterally without erythema or bulging.   Nose:  Nasal turbinates normal BL Mouth: Some mild swelling external of Right lower mandible. None on  Left side.  None in neck/throat, only along mandible itself. TTP along here, mostly at angle of mandible.  No redness or heat.  No fluctuance.  Oral cavity:  MMM.  S/p molar removal Right lower mandible.  No infection or redness or drainage here.  Nontender when I tap mandible/teeth with tongue depressor.  Tonsils +2, nonenlarged, non-erythematous. Neck: No cervical lymphadenopathy noted Heart:  RRR, no murmurs auscultated. Pulm:  Minimal wheezing BL bases.    Assessment and Plan:  1.  Submandibular swelling: - believe this is cellulitis of submandibular gland/perhaps parotid, but not actually within patient's mouth.  No evidence of intraoral  infection - no red flags, no evidence of infection spreading to neck or inhibiting swallowing, breathing, eating.  - switching to keflex plus clindamycin  - No current evidence of abscess -- but I'm concerned this will occur if she is not treated - FU in 48 hours to re-assess.  - If swelling still present, will need to send for CT scan to rule out abscess.   2.  Wheezing: - history of asthma.  - With wheezing here.  - treating as asthma exacerbation.  Albuterol inhaler.  - she is on daily prednisone.  Do not see reason to increase this currently - Hycodan as cough syrup for symptomatic relief.

## 2017-01-21 ENCOUNTER — Telehealth: Payer: Self-pay | Admitting: Family Medicine

## 2017-01-21 MED ORDER — ALBUTEROL SULFATE HFA 108 (90 BASE) MCG/ACT IN AERS: 1.0000 | INHALATION_SPRAY | Freq: Four times a day (QID) | RESPIRATORY_TRACT | 0 refills | Status: DC | PRN

## 2017-01-21 NOTE — Telephone Encounter (Signed)
Called and spoke to pharmacy - her insurance states she must have her albuterol sent by mail order because she has had 2 filled at a local pharmacy- she can call her insurance to ask for an override due to need for medication faster than mail order can get medicine to her or see if they will cover a 3 month supply - pt states that her insurance will cover ventolin so we will send a test.

## 2017-01-21 NOTE — Telephone Encounter (Signed)
Ventolin/proair called to [facility], pt will check there later. She states her insurance told her we need to rx ventolin or proair

## 2017-01-21 NOTE — Telephone Encounter (Signed)
Pt states that her insurance will not cover albuterol (PROVENTIL HFA;VENTOLIN HFA) 108 (90 Base) MCG/ACT inhaler [[id number]]. She wanted to know is there anything else you can call in for her.

## 2017-01-22 ENCOUNTER — Telehealth: Payer: Self-pay | Admitting: Family Medicine

## 2017-01-22 NOTE — Telephone Encounter (Signed)
Per Dr. Gwendolyn Grant, pt is to take Keflex BID instead of x3/per day and continue taking Clindamycin 3x/per day. Advised to please call us if no improvement in nausea and dizziness

## 2017-01-22 NOTE — Telephone Encounter (Signed)
ERROR

## 2017-01-22 NOTE — Telephone Encounter (Signed)
Pt is feeling dizzy and nauseous after taking the two scripts we gave her. cephALEXin (KEFLEX) 500 MG capsule [[id number] and clindamycin (CLEOCIN) 300 MG capsule [[id number]]. Please advise at [phone]

## 2017-10-27 ENCOUNTER — Other Ambulatory Visit: Payer: Self-pay | Admitting: *Deleted

## 2017-10-27 MED ORDER — ONDANSETRON HCL 4 MG/2ML IJ SOLN
4.00 | INTRAMUSCULAR | Status: DC
Start: ? — End: 2017-10-27

## 2017-10-27 MED ORDER — OXYCODONE HCL 5 MG PO TABS
10.00 | ORAL_TABLET | ORAL | Status: DC
Start: ? — End: 2017-10-27

## 2017-10-27 MED ORDER — TRAMADOL HCL 50 MG PO TABS
100.00 | ORAL_TABLET | ORAL | Status: DC
Start: ? — End: 2017-10-27

## 2017-10-27 MED ORDER — ACETAMINOPHEN 500 MG PO TABS
1000.00 | ORAL_TABLET | ORAL | Status: DC
Start: ? — End: 2017-10-27

## 2017-10-27 MED ORDER — MELATONIN 3 MG PO TABS
3.00 | ORAL_TABLET | ORAL | Status: DC
Start: 2017-10-26 — End: 2017-10-27

## 2017-10-27 MED ORDER — ALPRAZOLAM 0.25 MG PO TABS
0.25 | ORAL_TABLET | ORAL | Status: DC
Start: ? — End: 2017-10-27

## 2017-10-27 MED ORDER — AZATHIOPRINE 50 MG PO TABS
150.00 | ORAL_TABLET | ORAL | Status: DC
Start: 2017-10-27 — End: 2017-10-27

## 2017-10-27 MED ORDER — HEPARIN SODIUM (PORCINE) 5000 UNIT/ML IJ SOLN
5000.00 | INTRAMUSCULAR | Status: DC
Start: 2017-10-26 — End: 2017-10-27

## 2017-10-27 MED ORDER — BUDESONIDE 3 MG PO CPEP
9.00 | ORAL_CAPSULE | ORAL | Status: DC
Start: 2017-10-27 — End: 2017-10-27

## 2017-10-27 NOTE — Patient Outreach (Signed)
[facility]) Care Management  10/27/2017  Sharon [name] July 10, 1981 [id number]   Subjective: Telephone call to patient's home / mobile number, spoke with patient, and HIPAA verified.  Discussed [facility] Care Management [employer] Transition of care follow up, patient voiced understanding, and is in agreement to follow up.  Patient's is happy to speak with this RNCM today, states she is doing better, had a bad experience at the hospital, did not give any details of the experience, and states she is planning to discuss with hospital during patient satisfaction follow up call. States she has follow up appointment with primary MD on 10/31/17 and gastroenterologist on 11/02/17.   States she is wanting to change to another gastroenterologist, has called several in [place], they have declined to see her, and she does not know the rationale, advised to follow with primary MD to get a referral.    Advised most specialist referral an MD referral before they will see a patient.  Patient states she is also in need of some psychotherapy and is planning to discuss with primary MD.   Discussed strategies on how to obtain a referral from primary MD , patient voices understanding, and states she will follow.  Patient states she is able to manage self care and has assistance as needed with activities of daily living / home management.   Patient voices understanding of medical diagnosis and treatment plan.   States she is accessing her Sharon [name] benefits as needed via member services number on back of card or through [email or web address]   Patient requesting this RNCM to send a referral for her to be contacted by [facility] regarding psychotherapy benefit and behavioral health case management benefits.   RNCM advised  will contact [employer] Aligned CM and verify if patient has psychotherapy and / or behavioral health case management benefits, will refer if appropriate, and this RNCM will notify patient if she does not have this benefit, and  discuss next steps.    Patient states she does not have any education material, transition of care, care coordination, disease management, disease monitoring, transportation, community resource, or pharmacy [name] at this time.  States she is very appreciative of the follow up and is in agreement to receive [facility] Care Management information.  Telephone call to Sharon [name] at [facility], discussed patient referral request, states she will follow up, and update this RNCM on 10/31/17.    Objective: Per KPN (Knowledge Performance Now, point of care tool), [employer] iCollaborate, and chart review, patient hospitalized 10/25/17 -10/26/17 for Crohn's Disease.    Status post ileocecectomy resection with known perineal disease (numerous bilateral perianal fistulas).     Assessment: Received [employer] Transition of care referral on 10/27/17.   Transition of care follow up completed, no care management [name], and will proceed with case closure.     Plan: RNCM  will contact [employer] Aligned CM and verify if patient has psychotherapy and / or behavioral health case management benefits, will refer if appropriate, and this RNCM will notify patient if she does not have this benefit, and discuss next steps.  RNCM will send patient successful outreach letter, [facility] pamphlet, and magnet. RNCM will send case closure due to follow up completed / no care management [name] request to Baruch Gouty at [facility] Management.      [name] H. Gardiner Barefoot, BSN, CCM [facility] Care Management [facility] Telephonic CM Phone: [phone] Fax: [phone]

## 2017-10-28 ENCOUNTER — Encounter: Payer: Self-pay | Admitting: *Deleted

## 2017-10-31 ENCOUNTER — Other Ambulatory Visit: Payer: Self-pay | Admitting: *Deleted

## 2017-10-31 NOTE — Patient Outreach (Signed)
[facility]) Care Management  10/31/2017  Sharon [name] March 15, 1981 [id number]   Subjective: Spoke with Vickie Castings at [facility] Clinical Consultant) regarding referral process and how to verify member's behavioral health benefits via iCollaborate.   Vickie states member would receive follow up call from [facility] referral the referral. Sent the following secure email referral via iCollaborate : 10/27/17 Spoke with patient completed TOC, had a recent Crohn's flare, states she is in need of psychotherapy, whatever assistance Rosann Auerbach could provide, and will discuss a referral with her PCP on 10/31/17. States she has a lot going on and needs some assistance through psychotherapy. She did not give any specifics and stated she did not want to answer a lot of questions. Please give member a call she is expecting a call from someone. Thanks, Vickie states member would receive follow up call from [place] referral recipient.      RNCM sent [name] [facility] Aligned RNCM), who is out of the office today, and email update regarding the referral, and requested update if patient needs any additional follow up from this RNCM.     Objective: Per KPN (Knowledge Performance Now, point of care tool), [employer] iCollaborate, and chart review, patient hospitalized 10/25/17 -10/26/17 for Crohn's Disease.    Status post ileocecectomy resection with known perineal disease (numerous bilateral perianal fistulas).     Assessment: Received [employer] Transition of care referral on 10/27/17.   Transition of care follow up completed, no care management needs, referral completed for psychotherapy / behavioral health CM resources, and case will remain closed.     Plan: Case will remain closed.      Byan Poplaski H. Gardiner Barefoot, BSN, CCM [facility] Care Management [facility] Telephonic CM Phone: [phone] Fax: [phone]

## 2018-11-14 ENCOUNTER — Ambulatory Visit: Payer: 59 | Admitting: Internal Medicine

## 2018-11-14 NOTE — Progress Notes (Deleted)
Patient ID: Sharon [name], female   DOB: 1980-12-04, 38 y.o.   MRN: [id number]   HPI: Sharon [name] is a 38 y.o. female, referred by Dr. Billy Coast, in consultation for POI (elevated [facility]).  Pt describes: - menarche at *** - irregular menses - no h/o ovarian cysts - children: *** - miscarriages: *** - contraception: ***  She has no history of chemotherapy, radiation therapy, or pelvic surgery.  She does have a history of autoimmune diseases: Crohn's disease.  Previous investigation: 09/05/2018:  FSH 86.8 TSH 2.0, free T4 1.25 No results found for: TSH, FREET4  Transvaginal ultrasound (05/19/2016): Left hemorrhagic cyst measuring 5.2 x 4.7 x 3.4 cm.  This was found during investigation for left lower quadrant pain.  Of note, the cyst was also seen on CT abdomen from 2006 and he was measuring 7.5 cm then.  The latest CT abdomen is from 01/15/2018 and this showed no adnexal abnormalities: 1.4 cm low-attenuation lesion associated with the left side of the cervix, likely a Nabothian cyst. Ovaries are unremarkable in appearance.  On the CT, the there was evidence of Crohn's disease.  DXA (07/23/2016): Normal  She denies acne, hirsutism, significant weight gain, wide, purple stretch marks.  She has no FH of infertility/amenorrhea.  ROS: Constitutional: no weight gain/loss, no fatigue, no subjective hyperthermia/hypothermia Eyes: no blurry vision, no xerophthalmia ENT: no sore throat, no nodules palpated in throat, no dysphagia/odynophagia, no hoarseness Cardiovascular: no CP/SOB/palpitations/leg swelling Respiratory: no cough/SOB Gastrointestinal: no N/V/D/C Musculoskeletal: no muscle/joint aches Skin: no acne, no hair on face, no dark discoloration of skin Neurological: no tremors/numbness/tingling/dizziness Psychiatric: no depression/anxiety  PE: There were no vitals taken for this visit. Wt Readings from Last 3 Encounters:  09/25/15 141 lb 3.2 oz (64 kg)  05/12/12 176 lb 12.8 oz  (80.2 kg)  03/27/12 185 lb (83.9 kg)   Constitutional: Normal weight, in NAD, no full supraclavicular fat pads Eyes: PERRLA, EOMI, no exophthalmos ENT: moist mucous membranes, no thyromegaly, no cervical lymphadenopathy Cardiovascular: RRR, No MRG Respiratory: CTA B Gastrointestinal: abdomen soft, NT, ND, BS+ Musculoskeletal: no deformities, strength intact in all 4 Skin: moist, warm; no acne on face, no dark terminal hair on chin, no acanthosis nigricans, no purple, wide, stretch marks Neurological: no tremor with outstretched hands, DTR normal in all 4  ASSESSMENT: 1. Oligomenorrhea  PLAN: 1.  I had a long discussion with the patient about possible causes of oligomenorrhea.  She is not on any medicines that can cause this problem  She cannot be pregnant since she is not sexually active  No galactorrhea to suggest hyperprolactinemia, but I would like to check a prolactin level  No hypothyroidism/hyperthyroidism per review of recent TSH  She has had normal menses after menarche, so doubt outflow obstruction  She does not have other features of PCOS: acne, hirsutism, high testosterone, LH/FSH >2, ovarian cysts. We discussed about what the syndrome means  She does not have other autoimmune diseases to suggest autoimmune premature ovarian insufficiency, but I would like to check adrenal antibodies since this is the best test to evaluate for ovarian autoimmunity (not ovarian antibodies, which are not specific enough). We discussed about POI and the fact that if she does have this, she will need hormone replacement therapy to preserve her bones.  To further investigate for premature ovarian insufficiency, I will check an inhibin level to evaluate her ovarian reserve  Doubt [place]-CAH because no signs of hyperandrogenism, but would like to check a 17 hydroxyprogesterone level to  make sure  We may need to rule out fragile X syndrome by FMR testing   Doubt Turner syndrome since she is  tall, but she may have a Turner mosaic profile - we may need a karyotype - no immediate plans for pregnancy - I will order the above labs, and the patient will come to have this done at 8 AM.  Sharon Pavlov, MD PhD [facility] Endocrinology

## 2018-11-21 ENCOUNTER — Ambulatory Visit: Payer: Self-pay | Admitting: Internal Medicine

## 2018-11-21 NOTE — Progress Notes (Deleted)
Patient ID: Sharon [name], female   DOB: 1981/08/20, 38 y.o.   MRN: [id number]   HPI: Sharon [name] is a 38 y.o. female, referred by Dr. Spero Geralds, in consultation for premature ovarian insufficiency (POI) -elevated FSH.  Patient describes: - menarche at *** - irregular menses - no h/o ovarian cysts - children: *** - miscarriages: *** - contraception: ***  She has no history of chemotherapy, radiation therapy, or pelvic surgery.  She does have a history of autoimmune diseases: Crohn's disease.  Previous investigation: 09/05/2018:  FSH 86.8 TSH 2.0, free T4 1.25 No results found for: TSH, FREET4  Transvaginal ultrasound (05/19/2016): Left hemorrhagic cyst measuring 5.2 x 4.7 x 3.4 cm.  This was found during investigation for left lower quadrant pain.  Of note, the cyst was also seen on CT abdomen (2006) and it was measuring 7.5 cm then.  The latest CT abdomen is from 01/15/2018 and this showed no adnexal abnormalities.  She had a 1.4 cm low-attenuation lesion associated with the left side of the cervix, likely a nabothian cyst.  Ovaries were unremarkable in appearance.  There was evidence of Crohn's disease.  DXA scan (07/23/2016): Normal  She denies acne, hirsutism, significant weight gain, wide, purple, stretch marks.  She has no family history of infertility/amenorrhea.  ROS: Constitutional: no weight gain/no weight loss, no fatigue, no subjective hyperthermia, no subjective hypothermia Eyes: no blurry vision, no xerophthalmia ENT: no sore throat, no nodules palpated in neck, no dysphagia, no odynophagia, no hoarseness Cardiovascular: no CP/no SOB/no palpitations/no leg swelling Respiratory: no cough/no SOB/no wheezing Gastrointestinal: no N/no V/no D/no C/no acid reflux Musculoskeletal: no muscle aches/no joint aches Skin: no rashes, no hair loss Neurological: no tremors/no numbness/no tingling/no dizziness  PE: There were no vitals taken for this visit. Wt Readings from Last  3 Encounters:  09/25/15 141 lb 3.2 oz (64 kg)  05/12/12 176 lb 12.8 oz (80.2 kg)  03/27/12 185 lb (83.9 kg)   Constitutional: Normal weight, in NAD, no full supraclavicular fat pads Eyes: PERRLA, EOMI, no exophthalmos ENT: moist mucous membranes, no thyromegaly, no cervical lymphadenopathy Cardiovascular: RRR, No MRG Respiratory: CTA B Gastrointestinal: abdomen soft, NT, ND, BS+ Musculoskeletal: no deformities, strength intact in all 4 Skin: moist, warm, no acne on face, no, terminal hair on chin, no acanthosis nigricans, no purple, wide, stretch marks Neurological: no tremor with outstretched hands, DTR normal in all 4  ASSESSMENT: 1.  Oligomenorrhea with elevated FSH - Possible POI  PLAN: 1.  I had a long discussion with the patient about possible causes of oligomenorrhea.  She is not on any medicines that can cause this problem  She cannot be pregnant since she is not sexually active  No galactorrhea to suggest hyperprolactinemia, but I would like to check a prolactin level  No hypothyroidism/hyperthyroidism per review of recent TSH  She has had normal menses after menarche, so doubt outflow obstruction  She does not have other features of PCOS: acne, hirsutism, high testosterone, LH/FSH >2, ovarian cysts. We discussed about what the syndrome means  She does not have other autoimmune diseases to suggest autoimmune premature ovarian insufficiency, but I would like to check adrenal antibodies since this is the best test to evaluate for ovarian autoimmunity (not ovarian antibodies, which are not specific enough). We discussed about POI and the fact that if she does have this, she will need hormone replacement therapy to preserve her bones.  To further investigate for premature ovarian insufficiency, I will check an inhibin  level to evaluate her ovarian reserve  Doubt [place]-CAH because no signs of hyperandrogenism, but would like to check a 17 hydroxyprogesterone level to make  sure  We may need to rule out fragile X syndrome by FMR testing   Doubt Turner syndrome since she is tall, but she may have a Turner mosaic profile - we may need a karyotype - no immediate plans for pregnancy - I will order the above labs, and the patient will come to have this done at 8 AM. -Discussed about consequences of POI to include menopausal symptoms: Hot flashes, mucosal dryness, insomnia, mental fog, elevated cardiovascular risk, increased osteoporotic risk. -We also discussed about treatment for POI in case she does not desire a pregnancy: Usually hormone replacement therapy with bioidentical progesterone and estrogen patch.  However, if she desires contraception, we can use a progesterone IUD with an estrogen patch.  Carlus Pavlov, MD PhD [facility] Endocrinology

## 2018-12-11 ENCOUNTER — Ambulatory Visit: Payer: Managed Care, Other (non HMO) | Admitting: Internal Medicine

## 2018-12-11 ENCOUNTER — Other Ambulatory Visit: Payer: Self-pay

## 2018-12-11 ENCOUNTER — Encounter: Payer: Self-pay | Admitting: Internal Medicine

## 2018-12-11 VITALS — BP 118/78 | HR 84 | Ht 66.0 in | Wt 180.0 lb

## 2018-12-11 DIAGNOSIS — E2839 Other primary ovarian failure: Secondary | ICD-10-CM

## 2018-12-11 DIAGNOSIS — E288 Other ovarian dysfunction: Secondary | ICD-10-CM | POA: Diagnosis not present

## 2018-12-11 LAB — LIPID PANEL
CHOLESTEROL: 130 mg/dL (ref 0–200)
HDL: 70.3 mg/dL (ref >39.00)
LDL Cholesterol: 46 mg/dL (ref 0–99)
NonHDL: 59.24
TRIGLYCERIDES: 65 mg/dL (ref 0.0–149.0)
Total CHOL/HDL Ratio: 2
VLDL: 13 mg/dL (ref 0.0–40.0)

## 2018-12-11 LAB — LUTEINIZING HORMONE: LH: 30.25 m[IU]/mL

## 2018-12-11 LAB — TSH: TSH: 1.2 u[IU]/mL (ref 0.35–4.50)

## 2018-12-11 LAB — T4, FREE: FREE T4: 0.73 ng/dL (ref 0.60–1.60)

## 2018-12-11 LAB — FOLLICLE STIMULATING HORMONE: FSH: 41 m[IU]/mL

## 2018-12-11 MED ORDER — NORETHINDRONE ACET-ETHINYL EST 1-20 MG-MCG PO TABS: 1.0000 | ORAL_TABLET | Freq: Every day | ORAL | 3 refills | Status: DC

## 2018-12-11 NOTE — Patient Instructions (Signed)
-   Please stop by the lab today, we will contact you with any results.  - Please do  Not start your hormonal therapy until you hear from [place] first.

## 2018-12-11 NOTE — Progress Notes (Addendum)
Name: Sharon [name]  MRN/ DOB: [id number], [birth year]    Age/ Sex: 38 y.o., female    PCP: Kirby Funk, MD   Reason for Endocrinology Evaluation: Premature ovarian failure      Date of Initial Endocrinology Evaluation: 12/11/2018     HPI: [name] is a 38 y.o. female with a past medical history of Crohn's disease and migraine headaches. The patient presented for initial endocrinology clinic visit on 12/11/2018 for consultative assistance with her premature ovarian failure   She was diagnosed with premature ovarian failure ~ 2 yrs ago , during work up for infertility, she was tried on a couple of cycles of clomid without success. Last year she was on loestron-fe for a few months but it was too costly.    Menarche at age 71  LMP 9/ 2019   She has no FH of premature ovarian failure  Has a brother with no learning disability    She has hot flashes but these have been improving.  Denies vaginal dryness.  She is not sexually active at this time.   Pt is very depressed and is taking celexa at this time.   Yesterday she presented to urgent care with abdominal cramps, she received  IM  Injection of glucocorticoid and currently on oral prednisone.   Denies tobacco use. No prior personal or FH of clotting disorder.    HISTORY:  Past Medical History:  Past Medical History:  Diagnosis Date  . Crohn's disease (HCC)    dx 1996  . History of abnormal cervical Pap smear   . History of genital warts   . Infertility   . Pelvic peritoneal adhesions, female   . Perirectal abscess     Past Surgical History:  Past Surgical History:  Procedure Laterality Date  . bladder perforation    . COLONOSCOPY  last one 02-25-2011  . LAPAROSCOPIC LYSIS INTESTINAL ADHESIONS    . RIGHT HEMICOLECTOMY/ ANASTOMOSIS/  REPAIR ABDOMINAL WALL HERNIA/  RIGHT SALPINOOPHORECTOMY  01/ 2005   CROHN'S      Social History:  reports that she has never smoked. She has never used smokeless  tobacco. She reports that she does not drink alcohol or use drugs.  Family History: family history is not on file.   HOME MEDICATIONS: Allergies as of 12/11/2018   No Known Allergies     Medication List       Accurate as of December 11, 2018  1:09 PM. Always use your most recent med list.        albuterol 108 (90 Base) MCG/ACT inhaler Commonly known as:  Ventolin HFA Inhale 1-2 puffs into the lungs every 6 (six) hours as needed for wheezing or shortness of breath.   amoxicillin-clavulanate 875-125 MG tablet Commonly known as:  AUGMENTIN Take 1 tablet by mouth 2 (two) times daily.   budesonide 3 MG 24 hr capsule Commonly known as:  ENTOCORT EC Take by mouth.   cephALEXin 500 MG capsule Commonly known as:  KEFLEX Take 1 capsule (500 mg total) by mouth 3 (three) times daily.   clindamycin 300 MG capsule Commonly known as:  Cleocin Take 1 capsule (300 mg total) by mouth 3 (three) times daily.   Humira 40 MG/0.8ML Pskt Generic drug:  Adalimumab Inject 40 mg into the skin every Thursday.   HYDROcodone-homatropine 5-1.5 MG/5ML syrup Commonly known as:  HYCODAN Take 5 mLs by mouth every 8 (eight) hours as needed for cough.   loperamide 2 MG capsule  Commonly known as:  IMODIUM Take 1 capsule (2 mg total) by mouth 4 (four) times daily as needed for diarrhea or loose stools.   predniSONE 10 MG tablet Commonly known as:  DELTASONE Take 6 tablets (60 mg total) by mouth daily.   STELARA [place] Inject into the skin.         REVIEW OF SYSTEMS: A comprehensive ROS was conducted with the patient and is negative except as per HPI and below:  Review of Systems  Constitutional: Positive for malaise/fatigue. Negative for weight loss.  HENT: Negative for nosebleeds and sore throat.   Eyes: Negative for blurred vision and pain.  Respiratory: Negative for cough and shortness of breath.   Cardiovascular: Negative for chest pain and palpitations.  Gastrointestinal: Positive for  abdominal pain and diarrhea.  Genitourinary: Negative for frequency.  Neurological: Negative for tingling and tremors.  Endo/Heme/Allergies: Positive for polydipsia.  Psychiatric/Behavioral: Positive for depression. The patient is not nervous/anxious.        OBJECTIVE:  VS: BP 118/78 (BP Location: Right Arm, Patient Position: Sitting, Cuff Size: Normal)   Pulse 84   Ht 5\' 6"  (1.676 m)   Wt 180 lb (81.6 kg) Comment: per pt  SpO2 98%   BMI 29.05 kg/m    Wt Readings from Last 3 Encounters:  12/11/18 180 lb (81.6 kg)  09/25/15 141 lb 3.2 oz (64 kg)  05/12/12 176 lb 12.8 oz (80.2 kg)     EXAM: General: Pt appears well and is in NAD  Hydration: Well-hydrated with moist mucous membranes and good skin turgor  Eyes: External eye exam normal without stare, lid lag or exophthalmos.  EOM intact.   Ears, Nose, Throat: Hearing: Grossly intact bilaterally Dental: Good dentition  Throat: Clear without mass, erythema or exudate  Neck: General: Supple without adenopathy. Thyroid: Thyroid size normal.  No goiter or nodules appreciated. No thyroid bruit.  Lungs: Clear with good BS bilat with no rales, rhonchi, or wheezes  Heart: Auscultation: RRR.  Abdomen: Normoactive bowel sounds, soft, nontender, without masses or organomegaly palpable  Extremities:  BL LE: No pretibial edema normal ROM and strength.  Skin: Hair: Texture and amount normal with gender appropriate distribution Skin Inspection: No rashes Skin Palpation: Skin temperature, texture, and thickness normal to palpation  Neuro: Cranial nerves: II - XII grossly intact  Cerebellar: Normal coordination and movement; no tremor Motor: Normal strength throughout DTRs: 2+ and symmetric in UE without delay in relaxation phase  Mental Status: Judgment, insight: Intact Orientation: Oriented to time, place, and person Memory: Intact for recent and remote events Mood and affect: No depression, anxiety, or agitation     DATA REVIEWED:  09/06/2018 FSH 86.8 miu/mL   TSH 2.0 uIU/mL    Results for FONNIE, CASADO (MRN [id number]) as of 12/12/2018 09:17  Ref. Range 12/11/2018 14:08 12/11/2018 15:12  Total CHOL/HDL Ratio Unknown 2   Cholesterol Latest Ref Range: 0 - 200 mg/dL 505   HDL Cholesterol Latest Ref Range: >39.00 mg/dL 39.76   LDL (calc) Latest Ref Range: 0 - 99 mg/dL 46   NonHDL Unknown 73.41   Triglycerides Latest Ref Range: 0.0 - 149.0 mg/dL 93.7   VLDL Latest Ref Range: 0.0 - 40.0 mg/dL 90.2   LH Latest Units: mIU/mL 30.25   FSH Latest Units: mIU/ML 41.0   Prolactin Latest Units: ng/mL  8.9  Estradiol Latest Units: pg/mL 55   Preg, Serum Unknown NEGATIVE   TSH Latest Ref Range: 0.35 - 4.50 uIU/mL 1.20  T4,Free(Direct) Latest Ref Range: 0.60 - 1.60 ng/dL 2.53     ASSESSMENT/PLAN/RECOMMENDATIONS:   1. Premature Ovarian Insufficiency (POI):  - She is currently is having mild vasomotor symptoms.  - She is not interested in fertility at this time, we briefly discussed that she could conceive if she desires with the help with an egg donor and proper hormonal support through infertility physicians.  - We have discussed her high risk for bone loss, osteoporosis, and diminished well-being.  - There's conflicting information about the presence of one vs 2 ovaries, her surgical hx indicates right oophrectomy but patient states she was told during hysteroscopy that she has  a partial ovary on one side and a normal ovary on the other side ?.  - We discussed the importance of female hormone replacement for the above benefits. I am leaning toward OCP due to ease of take rather then estradiol with cyclic progesterone use, she has cronh's and is depressed and the latter regimen may be more stressful on her at this time.    - Will obtain pregnancy test, TFT's, repeat FSH/LH and estradiol level, prolactin. - Will also obtain karyotyping - Will also proceed with adrenal autoantibodies, if this is positive will check 8 am  cortisol /ACTH , as she is at risk for autoimmune adrenal failure.  - We also discussed her risk for autoimmune hypothyroidism - Will consider DXA in the future.  - Lipid panel as a baseline.    Medications : Loestrin 1-20 daily    F/u in 3 months    Forty -five minutes were spent with the patient, > 50 % of the time was spent in counseling and education.     Addendum: Discussed her lab results on 12/12/2018 at 8:30 AM.  I have advised her to start OCPs, patient is interested in the NuvaRing, I have advised her to discuss this with her GYN.    Signed electronically by: Lyndle Herrlich, MD  [facility] Endocrinology  [facility] [address] [place], [place] [address] Phone: [phone] FAX: [phone]   CC: Kirby Funk, MD [address] [place] [address] Phone: [phone] Fax: [phone]   Return to Endocrinology clinic as below: No future appointments.

## 2018-12-12 ENCOUNTER — Telehealth: Payer: Self-pay | Admitting: Internal Medicine

## 2018-12-12 LAB — PROLACTIN: Prolactin: 8.9 ng/mL

## 2018-12-12 MED ORDER — NORETHINDRONE ACET-ETHINYL EST 1-20 MG-MCG PO TABS: 1.0000 | ORAL_TABLET | Freq: Every day | ORAL | 3 refills | Status: DC

## 2018-12-12 NOTE — Telephone Encounter (Signed)
I informed pt that what was discussed would be on her AVS but she stated that she wanted your notes on the visit. I also informed pt that you have not had a chance to review her labs yet but once you have that I can mail a copy to her or that she can stop by the office and pick up a copy.

## 2018-12-12 NOTE — Telephone Encounter (Signed)
Patient stated she is wanting all "records" from her visit yesterday with Dr Lonzo Cloud   When patient was asked what exactly she was needing she stated Labs, office notes. Etc . Anything that pertains  To her visit yesterday   Please advise Patient would like a call when these records are ready for pick up

## 2018-12-12 NOTE — Telephone Encounter (Signed)
Informed pt to come in and sign a medical release form for her records

## 2018-12-22 LAB — 21-HYDROXYLASE ANTIBODIES: 21-HYDROXYLASE ANTIBODIES: NEGATIVE

## 2018-12-22 LAB — ESTRADIOL: Estradiol: 55 pg/mL

## 2018-12-22 LAB — HCG, SERUM, QUALITATIVE: Preg, Serum: NEGATIVE

## 2019-01-04 ENCOUNTER — Encounter: Payer: Self-pay | Admitting: Internal Medicine

## 2019-03-13 ENCOUNTER — Encounter: Payer: Self-pay | Admitting: Internal Medicine

## 2019-03-13 ENCOUNTER — Ambulatory Visit (INDEPENDENT_AMBULATORY_CARE_PROVIDER_SITE_OTHER): Payer: Self-pay | Admitting: Internal Medicine

## 2019-03-13 ENCOUNTER — Other Ambulatory Visit: Payer: Self-pay

## 2019-03-13 VITALS — BP 104/62 | HR 96 | Temp 98.7°F

## 2019-03-13 DIAGNOSIS — E2839 Other primary ovarian failure: Secondary | ICD-10-CM

## 2019-03-13 DIAGNOSIS — E288 Other ovarian dysfunction: Secondary | ICD-10-CM

## 2019-03-13 MED ORDER — NORETHINDRONE ACET-ETHINYL EST 1-20 MG-MCG PO TABS: 1.0000 | ORAL_TABLET | Freq: Every day | ORAL | 3 refills | Status: DC

## 2019-03-13 NOTE — Progress Notes (Signed)
Name: Sharon [name]  MRN/ DOB: [id number], 1981/09/26    Age/ Sex: 38 y.o., female     PCP: Lavone Orn, MD   Reason for Endocrinology Evaluation: Premature ovarian failure      Initial Endocrinology Clinic Visit: 12/11/2018    PATIENT IDENTIFIER: [name] is a 38 y.o., female with a past medical history of Crohn's disease and migraine headaches. She has followed with [facility] since 12/11/2018 for consultative assistance with management of her premature ovarian failure.  HISTORICAL SUMMARY: The patient was first diagnosed with ovarian failure ~ 2018 , during work up for infertility, she was tried on a couple of cycles of clomid without success. In 2019 she was on loestron-fe for a few months but it was too costly.    Menarche at age 52  LMP 9/ 2019   She has no FH of premature ovarian failure  Has a brother with no learning disability   SUBJECTIVE:   During last visit (12/11/2018): offered loestrin 1-20 mg daily   Today (03/13/2019):  [name] is here for a 3 month followup on premature ovarian failure. She has started loestrin . She has been getting regular menstruation. She denies any headaches or side effects. She does have occasional nausea and constipation that she attributes to crohn's disease.  She continues to be depressed, her citalopram dose was recently increased.   Sees Dr. Ronita Hipps and up to date on pap smear per pt.    Last pap smear 10/2018    ROS:  As per HPI.   HISTORY:  Past Medical History:  Past Medical History:  Diagnosis Date  . Crohn's disease ([place])    dx 1996  . History of abnormal cervical Pap smear   . History of genital warts   . Infertility   . Pelvic peritoneal adhesions, female   . Perirectal abscess    Past Surgical History:  Past Surgical History:  Procedure Laterality Date  . bladder perforation    . COLONOSCOPY  last one 02-25-2011  . LAPAROSCOPIC LYSIS INTESTINAL ADHESIONS    . RIGHT  HEMICOLECTOMY/ ANASTOMOSIS/  REPAIR ABDOMINAL WALL HERNIA/  RIGHT SALPINOOPHORECTOMY  01/ 2005   CROHN'S    Social History:  reports that she has never smoked. She has never used smokeless tobacco. She reports that she does not drink alcohol or use drugs. Family History:  Family History  Problem Relation Age of Onset  . Healthy Brother      HOME MEDICATIONS: Allergies as of 03/13/2019   No Known Allergies     Medication List       Accurate as of March 13, 2019  4:20 PM. If you have any questions, ask your nurse or doctor.        STOP taking these medications   budesonide 3 MG 24 hr capsule Commonly known as: ENTOCORT EC Stopped by: Dorita Sciara, MD   predniSONE 10 MG tablet Commonly known as: DELTASONE Stopped by: Dorita Sciara, MD     TAKE these medications   citalopram 20 MG tablet Commonly known as: CELEXA Take 40 mg by mouth daily.   loperamide 2 MG capsule Commonly known as: IMODIUM Take 1 capsule (2 mg total) by mouth 4 (four) times daily as needed for diarrhea or loose stools.   norethindrone-ethinyl estradiol 1-20 MG-MCG tablet Commonly known as: Loestrin 1/20 (21) Take 1 tablet by mouth daily.   STELARA Cromwell Inject into the skin.  OBJECTIVE:   PHYSICAL EXAM: VS: BP 104/62 (BP Location: Left Arm, Patient Position: Sitting, Cuff Size: Large)   Pulse 96   Temp 98.7 F (37.1 C) (Oral)   SpO2 98%    EXAM: General: Pt appears well and is in NAD  Neck: General: Supple without adenopathy. Thyroid: Thyroid size normal.  No goiter or nodules appreciated. No thyroid bruit.  Lungs: Clear with good BS bilat with no rales, rhonchi, or wheezes  Heart: Auscultation: RRR.  Abdomen: Normoactive bowel sounds, soft, nontender, without masses or organomegaly palpable  Extremities:  BL LE: No pretibial edema normal ROM and strength.  Skin: Hair: Texture and amount normal with gender appropriate distribution Skin Inspection: No rashes.  Skin Palpation: Skin temperature, texture, and thickness normal to palpation  Neuro: Cranial nerves: II - XII grossly intact  Motor: Normal strength throughout DTRs: 2+ and symmetric in UE without delay in relaxation phase  Mental Status: Judgment, insight: Intact Orientation: Oriented to time, place, and person Mood and affect: No depression, anxiety, or agitation     DATA REVIEWED: Results for Jake SeatsMUDA, Dehlia O (MRN [id number]) as of 03/14/2019 07:43  Ref. Range 03/13/2019 14:06  Total CHOL/HDL Ratio Latest Ref Range: 0.0 - 4.4 ratio 1.9  Cholesterol, Total Latest Ref Range: 100 - 199 mg/dL 409136  HDL Cholesterol Latest Ref Range: >39 mg/dL 70  LDL (calc) Latest Ref Range: 0 - 99 mg/dL 50  Triglycerides Latest Ref Range: 0 - 149 mg/dL 80  VLDL Cholesterol Cal Latest Ref Range: 5 - 40 mg/dL 16     ASSESSMENT / PLAN / RECOMMENDATIONS:   1. Premature ovarian Failure:  - She is tolerating loestrin without side effects - Her hot flashes have resolved - She is getting regular menstrual bleed with hormonal therapy - Will check Tg levels  - We did discuss the benefit of hormone replacement in improving well being, controlling vasomotor symptoms and for bone health    Medications   Loestrin 1/20  daily    2. Depression :  She is on citalopram, she is seeing a therapist.    Signed electronically by: Lyndle HerrlichAbby Jaralla Shamleffer, MD  [facility]  [facility] [address] [place], [place] [address] Phone: [phone] FAX: [phone]      CC: Kirby FunkGriffin, John, MD [address] [place] [address] Phone: [phone]  Fax: [phone]   Return to Endocrinology clinic as below: Future Appointments  Date Time Provider Department Center  03/12/2020  8:30 AM Shamleffer, Konrad DoloresIbtehal Jaralla, MD [facility] None

## 2019-03-14 DIAGNOSIS — E2839 Other primary ovarian failure: Secondary | ICD-10-CM | POA: Insufficient documentation

## 2019-03-14 LAB — LIPID PANEL
Chol/HDL Ratio: 1.9 ratio (ref 0.0–4.4)
Cholesterol, Total: 136 mg/dL (ref 100–199)
HDL: 70 mg/dL (ref >39)
LDL Calculated: 50 mg/dL (ref 0–99)
Triglycerides: 80 mg/dL (ref 0–149)
VLDL Cholesterol Cal: 16 mg/dL (ref 5–40)

## 2019-07-18 ENCOUNTER — Telehealth: Payer: Self-pay | Admitting: Internal Medicine

## 2019-07-18 ENCOUNTER — Other Ambulatory Visit: Payer: Self-pay | Admitting: Endocrinology

## 2019-07-18 MED ORDER — CONJ ESTROG-MEDROXYPROGEST ACE PO TABS: 1.0000 | ORAL_TABLET | Freq: Every day | ORAL | 0 refills | Status: DC

## 2019-07-18 NOTE — Telephone Encounter (Signed)
Prescription has been sent, she will need to schedule follow-up with Dr. Chauncey Cruel since only a 30-day prescription has been sent

## 2019-07-18 NOTE — Telephone Encounter (Signed)
Patient requests to be called at ph# [phone] re: health concerns

## 2019-07-18 NOTE — Telephone Encounter (Signed)
She does not need lab work but she will need to be tried on a higher dose of estrogen and can start on Premphase which is estrogen and progesterone that will also cause menstrual cycles.  If she is agreeable to this will call a prescription

## 2019-07-18 NOTE — Telephone Encounter (Signed)
Please call pt to schedule f/u appt with Dr. Kelton Pillar

## 2019-07-18 NOTE — Telephone Encounter (Signed)
Returned pt call. States for the last 30 days she has been experiencing hot flashes, night sweats and has not had her period. She is still taking Loestrin as prescribed. Pt expressed she is very concerned and insisting she is needing labs drawn. LOV 03/13/2019. NOV 03/12/20. Pt was advised that Dr. Kelton Pillar is unavailable until 07/30/19 and that the on call provider would be made aware of her concerns. Routing this message to Dr. Dwyane Dee for further advice

## 2019-07-18 NOTE — Telephone Encounter (Signed)
Called pt and she has agreed to start Premphase. Requesting Rx be sent to [facility]

## 2019-07-19 NOTE — Telephone Encounter (Signed)
Patient called back in stating that medication was to expensive if we could send in another medicine that's affordable ....    Please advise

## 2019-07-19 NOTE — Telephone Encounter (Signed)
Please send estradiol tablet, 1 mg daily and also Provera generic 5 mg daily for the the last 10 days of the month

## 2019-07-20 MED ORDER — ESTRADIOL 1 MG PO TABS: 1.0000 mg | ORAL_TABLET | Freq: Every day | ORAL | 1 refills | Status: DC

## 2019-07-20 MED ORDER — MEDROXYPROGESTERONE ACETATE 5 MG PO TABS: ORAL_TABLET | ORAL | 1 refills | Status: DC

## 2019-07-20 NOTE — Telephone Encounter (Signed)
Both have been sent and LM for patient.

## 2019-07-20 NOTE — Addendum Note (Signed)
Addended by: Cardell Peach I on: 07/20/2019 08:29 AM   Modules accepted: Orders

## 2019-07-20 NOTE — Telephone Encounter (Signed)
Spoke to patient regarding the medications, she expressed understanding and not further questions at this time.

## 2020-03-12 ENCOUNTER — Ambulatory Visit: Payer: Self-pay | Admitting: Internal Medicine

## 2021-06-16 ENCOUNTER — Ambulatory Visit (HOSPITAL_COMMUNITY)
Admission: EM | Admit: 2021-06-16 | Discharge: 2021-06-16 | Disposition: A | Discharge status: Home / Self Care | Payer: Self-pay

## 2021-06-16 ENCOUNTER — Other Ambulatory Visit: Payer: Self-pay

## 2021-06-16 DIAGNOSIS — Z5321 Procedure and treatment not carried out due to patient leaving prior to being seen by health care provider: Secondary | ICD-10-CM

## 2021-06-16 NOTE — ED Notes (Signed)
Patient requesting infusion therapy for covid diagnosis.  Informed we do not do this at this location.  Patient said she did not need to check in or be seen if we dont offer the iv therapy treatment for covid

## 2021-06-16 NOTE — ED Provider Notes (Signed)
[facility]   Rhys Martini, PA-C 06/16/21 1156

## 2022-01-13 ENCOUNTER — Telehealth: Payer: Self-pay | Admitting: Gastroenterology

## 2022-01-13 NOTE — Telephone Encounter (Signed)
D.O.D Dr Russella Dar ? ?We receive a referral from Dr Denton Lank to schedule an office visit for Crohn's but pt has gi hx on epic within the last year and was recently seen on February. Pt would like to switch to our office to because it's closer to home. ? ?Will you accept? ?

## 2022-01-13 NOTE — Telephone Encounter (Signed)
Request received to transfer GI care from outside practice to [facility].  We appreciate the interest in our practice, however at this time due to capacity and high demand from patients without established GI providers we cannot accommodate this transfer.  Ability to accommodate future transfer requests may change over time and the patient can contact us again in 6-12 months if still interested in being seen at [facility].      ?

## 2022-03-30 NOTE — Progress Notes (Unsigned)
Name: Sharon [name]  MRN/ DOB: [id number], 01-29-1981    Age/ Sex: 41 y.o., female     PCP: Pcp, No   Reason for Endocrinology Evaluation: Premature ovarian failure      Initial Endocrinology Clinic Visit: 12/11/2018    PATIENT IDENTIFIER: [name] is a 41 y.o., female with a past medical history of Crohn's disease and migraine headaches. She has followed with [facility] since 12/11/2018 for consultative assistance with management of her premature ovarian failure.  HISTORICAL SUMMARY: The patient was first diagnosed with ovarian failure ~ 2018 , during work up for infertility, she was tried on a couple of cycles of clomid without success. In 2019 she was on loestron-fe for a few months but it was too costly.      Menarche at age 77  LMP 9/ 2019     She has no FH of premature ovarian failure  Has a brother with no learning disability     She was lost to follow up  from 11/2018 until 03/2022 SUBJECTIVE:   Today (03/30/2022):  Sharon [name] is here for a followup on premature ovarian failure. She has started loestrin . She has been getting regular menstruation. She denies any headaches or side effects. She does have occasional nausea and constipation that she attributes to crohn's disease.  She continues to be depressed, her citalopram dose was recently increased.   Sees Dr. Billy Coast and up to date on pap smear per pt.    Last pap smear 10/2018    ROS:  As per HPI.   HISTORY:  Past Medical History:  Past Medical History:  Diagnosis Date   Crohn's disease ([facility])    dx 1996   History of abnormal cervical Pap smear    History of genital warts    Infertility    Pelvic peritoneal adhesions, female    Perirectal abscess    Past Surgical History:  Past Surgical History:  Procedure Laterality Date   bladder perforation     COLONOSCOPY  last one 02-25-2011   LAPAROSCOPIC LYSIS INTESTINAL ADHESIONS     RIGHT HEMICOLECTOMY/ ANASTOMOSIS/  REPAIR ABDOMINAL  WALL HERNIA/  RIGHT SALPINOOPHORECTOMY  01/ 2005   CROHN'S   Social History:  reports that she has never smoked. She has never used smokeless tobacco. She reports that she does not drink alcohol and does not use drugs. Family History:  Family History  Problem Relation Age of Onset   Healthy Brother      HOME MEDICATIONS: Allergies as of 03/31/2022   No Known Allergies      Medication List        Accurate as of March 30, 2022  7:58 AM. If you have any questions, ask your nurse or doctor.          citalopram 20 MG tablet Commonly known as: CELEXA Take 40 mg by mouth daily.   conjugated estrogens-medroxyprogesteron Tabs tablet Commonly known as: PREMPHASE Take 1 tablet by mouth daily.   estradiol 1 MG tablet Commonly known as: ESTRACE Take 1 tablet (1 mg total) by mouth daily.   loperamide 2 MG capsule Commonly known as: IMODIUM Take 1 capsule (2 mg total) by mouth 4 (four) times daily as needed for diarrhea or loose stools.   medroxyPROGESTERone 5 MG tablet Commonly known as: PROVERA Take one tablet daily for the last 10 days of each month.   norethindrone-ethinyl estradiol 1-20 MG-MCG tablet Commonly known as: Loestrin 1/20 (21) Take 1 tablet  by mouth daily.   STELARA Merlin Inject into the skin.          OBJECTIVE:   PHYSICAL EXAM: VS: There were no vitals taken for this visit.   EXAM: General: Pt appears well and is in NAD  Neck: General: Supple without adenopathy. Thyroid: Thyroid size normal.  No goiter or nodules appreciated. No thyroid bruit.  Lungs: Clear with good BS bilat with no rales, rhonchi, or wheezes  Heart: Auscultation: RRR.  Abdomen: Normoactive bowel sounds, soft, nontender, without masses or organomegaly palpable  Extremities:  BL LE: No pretibial edema normal ROM and strength.  Skin: Hair: Texture and amount normal with gender appropriate distribution Skin Inspection: No rashes. Skin Palpation: Skin temperature, texture, and  thickness normal to palpation  Neuro: Cranial nerves: II - XII grossly intact  Motor: Normal strength throughout DTRs: 2+ and symmetric in UE without delay in relaxation phase  Mental Status: Judgment, insight: Intact Orientation: Oriented to time, place, and person Mood and affect: No depression, anxiety, or agitation     DATA REVIEWED: Results for Sharon [name], Sharon [name] (MRN [id number]) as of 03/14/2019 07:43  Ref. Range 03/13/2019 14:06  Total CHOL/HDL Ratio Latest Ref Range: 0.0 - 4.4 ratio 1.9  Cholesterol, Total Latest Ref Range: 100 - 199 mg/dL 482  HDL Cholesterol Latest Ref Range: >39 mg/dL 70  LDL (calc) Latest Ref Range: 0 - 99 mg/dL 50  Triglycerides Latest Ref Range: 0 - 149 mg/dL 80  VLDL Cholesterol Cal Latest Ref Range: 5 - 40 mg/dL 16     ASSESSMENT / PLAN / RECOMMENDATIONS:   Premature ovarian Failure:  - She is tolerating loestrin without side effects - Her hot flashes have resolved - She is getting regular menstrual bleed with hormonal therapy - Will check Tg levels  - We did discuss the benefit of hormone replacement in improving well being, controlling vasomotor symptoms and for bone health    Medications   Loestrin 1/20  daily       Signed electronically by: Lyndle Herrlich, MD  [facility]  [facility] [address] [place], [place] [address] Phone: [phone] FAX: [phone]      CC: Pcp, No No address on file Phone: None  Fax: None   Return to Endocrinology clinic as below: Future Appointments  Date Time Provider Department Center  03/31/2022  2:20 PM Kristell Wooding, Konrad Dolores, MD [facility] None

## 2022-03-31 ENCOUNTER — Encounter: Payer: Self-pay | Admitting: Internal Medicine

## 2022-03-31 ENCOUNTER — Ambulatory Visit (INDEPENDENT_AMBULATORY_CARE_PROVIDER_SITE_OTHER): Payer: Medicare Other | Admitting: Internal Medicine

## 2022-03-31 VITALS — BP 120/72 | HR 75 | Ht 66.0 in

## 2022-03-31 DIAGNOSIS — E2839 Other primary ovarian failure: Secondary | ICD-10-CM

## 2022-03-31 MED ORDER — NORETHIN ACE-ETH ESTRAD-FE 1-20 MG-MCG PO TABS: 1.0000 | ORAL_TABLET | Freq: Every day | ORAL | 3 refills | Status: DC

## 2022-03-31 MED ORDER — COMBIPATCH 0.05-0.14 MG/DAY TD PTTW: 1.0000 | MEDICATED_PATCH | TRANSDERMAL | 12 refills | Status: DC

## 2022-06-18 ENCOUNTER — Ambulatory Visit: Payer: Medicaid Other | Admitting: Internal Medicine

## 2022-08-12 ENCOUNTER — Other Ambulatory Visit: Payer: Self-pay

## 2022-08-12 DIAGNOSIS — E2839 Other primary ovarian failure: Secondary | ICD-10-CM

## 2022-08-24 ENCOUNTER — Other Ambulatory Visit: Payer: Self-pay | Admitting: Internal Medicine

## 2022-08-24 ENCOUNTER — Other Ambulatory Visit: Payer: Self-pay

## 2022-08-24 DIAGNOSIS — E2839 Other primary ovarian failure: Secondary | ICD-10-CM

## 2022-08-25 LAB — TSH: TSH: 1.17 mIU/L

## 2022-08-25 LAB — FOLLICLE STIMULATING HORMONE: FSH: 51.2 m[IU]/mL

## 2022-08-25 LAB — LUTEINIZING HORMONE: LH: 28.9 m[IU]/mL

## 2022-08-25 LAB — PROLACTIN: Prolactin: 5 ng/mL

## 2022-08-25 LAB — ESTRADIOL: Estradiol: <15 pg/mL

## 2022-08-25 LAB — T4, FREE: Free T4: 1.2 ng/dL (ref 0.8–1.8)

## 2022-10-06 ENCOUNTER — Emergency Department (HOSPITAL_COMMUNITY): Payer: Medicare Other

## 2022-10-06 ENCOUNTER — Encounter (HOSPITAL_COMMUNITY): Payer: Self-pay

## 2022-10-06 ENCOUNTER — Inpatient Hospital Stay (HOSPITAL_COMMUNITY)
Admission: EM | Admit: 2022-10-06 | Discharge: 2022-10-10 | DRG: 385 | Discharge status: Against Medical Advice | Payer: Medicare Other | Attending: Internal Medicine | Admitting: Internal Medicine

## 2022-10-06 DIAGNOSIS — E876 Hypokalemia: Secondary | ICD-10-CM | POA: Diagnosis present

## 2022-10-06 DIAGNOSIS — K50114 Crohn's disease of large intestine with abscess: Secondary | ICD-10-CM

## 2022-10-06 DIAGNOSIS — E538 Deficiency of other specified B group vitamins: Secondary | ICD-10-CM | POA: Diagnosis present

## 2022-10-06 DIAGNOSIS — R112 Nausea with vomiting, unspecified: Secondary | ICD-10-CM | POA: Diagnosis present

## 2022-10-06 DIAGNOSIS — E860 Dehydration: Secondary | ICD-10-CM

## 2022-10-06 DIAGNOSIS — K5010 Crohn's disease of large intestine without complications: Secondary | ICD-10-CM | POA: Insufficient documentation

## 2022-10-06 DIAGNOSIS — K50014 Crohn's disease of small intestine with abscess: Secondary | ICD-10-CM | POA: Diagnosis present

## 2022-10-06 DIAGNOSIS — D649 Anemia, unspecified: Secondary | ICD-10-CM | POA: Diagnosis present

## 2022-10-06 DIAGNOSIS — K50919 Crohn's disease, unspecified, with unspecified complications: Principal | ICD-10-CM

## 2022-10-06 DIAGNOSIS — D638 Anemia in other chronic diseases classified elsewhere: Secondary | ICD-10-CM | POA: Diagnosis present

## 2022-10-06 DIAGNOSIS — R64 Cachexia: Secondary | ICD-10-CM | POA: Diagnosis present

## 2022-10-06 DIAGNOSIS — Z1152 Encounter for screening for COVID-19: Secondary | ICD-10-CM

## 2022-10-06 DIAGNOSIS — K651 Peritoneal abscess: Secondary | ICD-10-CM | POA: Diagnosis present

## 2022-10-06 DIAGNOSIS — N179 Acute kidney failure, unspecified: Secondary | ICD-10-CM | POA: Diagnosis present

## 2022-10-06 LAB — URINALYSIS, ROUTINE W REFLEX MICROSCOPIC
Bilirubin Urine: NEGATIVE
Glucose, UA: NEGATIVE mg/dL
Hgb urine dipstick: NEGATIVE
Ketones, ur: NEGATIVE mg/dL
Nitrite: NEGATIVE
Protein, ur: NEGATIVE mg/dL
Specific Gravity, Urine: 1.018 (ref 1.005–1.030)
pH: 5 (ref 5.0–8.0)

## 2022-10-06 LAB — COMPREHENSIVE METABOLIC PANEL
ALT: 18 U/L (ref 0–44)
AST: 18 U/L (ref 15–41)
Albumin: 3.7 g/dL (ref 3.5–5.0)
Alkaline Phosphatase: 50 U/L (ref 38–126)
Anion gap: 16 — ABNORMAL HIGH (ref 5–15)
BUN: 13 mg/dL (ref 6–20)
CO2: 27 mmol/L (ref 22–32)
Calcium: 9.4 mg/dL (ref 8.9–10.3)
Chloride: 96 mmol/L — ABNORMAL LOW (ref 98–111)
Creatinine, Ser: 1.14 mg/dL — ABNORMAL HIGH (ref 0.44–1.00)
GFR, Estimated: >60 mL/min (ref >60)
Glucose, Bld: 131 mg/dL — ABNORMAL HIGH (ref 70–99)
Potassium: 3.4 mmol/L — ABNORMAL LOW (ref 3.5–5.1)
Sodium: 139 mmol/L (ref 135–145)
Total Bilirubin: 1.2 mg/dL (ref 0.3–1.2)
Total Protein: 8.5 g/dL — ABNORMAL HIGH (ref 6.5–8.1)

## 2022-10-06 LAB — CBC
HCT: 38.9 % (ref 36.0–46.0)
Hemoglobin: 12.9 g/dL (ref 12.0–15.0)
MCH: 29.2 pg (ref 26.0–34.0)
MCHC: 33.2 g/dL (ref 30.0–36.0)
MCV: 88 fL (ref 80.0–100.0)
Platelets: 439 10*3/uL — ABNORMAL HIGH (ref 150–400)
RBC: 4.42 MIL/uL (ref 3.87–5.11)
RDW: 12.7 % (ref 11.5–15.5)
WBC: 11.9 10*3/uL — ABNORMAL HIGH (ref 4.0–10.5)
nRBC: 0 % (ref 0.0–0.2)

## 2022-10-06 LAB — I-STAT BETA HCG BLOOD, ED (MC, WL, AP ONLY): I-stat hCG, quantitative: <5 m[IU]/mL (ref <5)

## 2022-10-06 LAB — RESP PANEL BY RT-PCR (RSV, FLU A&B, COVID)  RVPGX2
Influenza A by PCR: NEGATIVE
Influenza B by PCR: NEGATIVE
Resp Syncytial Virus by PCR: NEGATIVE
SARS Coronavirus 2 by RT PCR: NEGATIVE

## 2022-10-06 LAB — LIPASE, BLOOD: Lipase: 62 U/L — ABNORMAL HIGH (ref 11–51)

## 2022-10-06 LAB — D-DIMER, QUANTITATIVE: D-Dimer, Quant: 3.48 ug/mL-FEU — ABNORMAL HIGH (ref 0.00–0.50)

## 2022-10-06 LAB — TROPONIN I (HIGH SENSITIVITY): Troponin I (High Sensitivity): <2 ng/L (ref <18)

## 2022-10-06 MED ORDER — SODIUM CHLORIDE 0.9 % IV BOLUS
1000.0000 mL | Freq: Once | INTRAVENOUS | Status: AC
  Administered 2022-10-06: 1000 mL via INTRAVENOUS

## 2022-10-06 MED ORDER — HYDROCODONE-ACETAMINOPHEN 5-325 MG PO TABS
1.0000 | ORAL_TABLET | Freq: Once | ORAL | Status: AC
  Administered 2022-10-06: 1 via ORAL
  Filled 2022-10-06: qty 1

## 2022-10-06 MED ORDER — HYDROMORPHONE HCL 1 MG/ML IJ SOLN
1.0000 mg | INTRAMUSCULAR | Status: DC | PRN
  Administered 2022-10-06 – 2022-10-07 (×4): 1 mg via INTRAVENOUS
  Filled 2022-10-06 (×4): qty 1

## 2022-10-06 MED ORDER — MORPHINE SULFATE (PF) 2 MG/ML IV SOLN
2.0000 mg | Freq: Once | INTRAVENOUS | Status: AC
  Administered 2022-10-06: 2 mg via INTRAVENOUS
  Filled 2022-10-06: qty 1

## 2022-10-06 MED ORDER — ONDANSETRON 4 MG PO TBDP
4.0000 mg | ORAL_TABLET | Freq: Once | ORAL | Status: AC
  Administered 2022-10-06: 4 mg via ORAL
  Filled 2022-10-06: qty 1

## 2022-10-06 MED ORDER — IOHEXOL 300 MG/ML  SOLN
100.0000 mL | Freq: Once | INTRAMUSCULAR | Status: AC | PRN
  Administered 2022-10-06: 100 mL via INTRAVENOUS

## 2022-10-06 MED ORDER — OXYCODONE-ACETAMINOPHEN 5-325 MG PO TABS: 1.0000 | ORAL_TABLET | Freq: Once | ORAL | Status: DC

## 2022-10-06 MED ORDER — METHYLPREDNISOLONE SODIUM SUCC 125 MG IJ SOLR
125.0000 mg | Freq: Once | INTRAMUSCULAR | Status: AC
  Administered 2022-10-06: 125 mg via INTRAVENOUS
  Filled 2022-10-06: qty 2

## 2022-10-06 MED ORDER — SODIUM CHLORIDE 0.9 % IV SOLN: INTRAVENOUS | Status: DC

## 2022-10-06 NOTE — Progress Notes (Signed)
Pt is up to room, alert and oriented. Stating that she is having a lot of abdominal pain. No pain medication has been ordered, at this time. Messaged Dr. Jonelle Sidle, and informed him of the pt's pain. Awaiting to hear back from him.

## 2022-10-06 NOTE — H&P (Signed)
History and Physical    Patient: Sharon [name] XQJ:[id number] DOB: 1981-06-24 DOA: 10/06/2022 DOS: the patient was seen and examined on 10/06/2022 PCP: Patient, No Pcp Per  Patient coming from: Home  Chief Complaint:  Chief Complaint  Patient presents with   Chest Pain   Abdominal Pain   HPI: Sharon [name] is a 42 y.o. female with medical history significant of known Crohn's disease status post previous surgery, severe cachexia, failure to thrive, who presents to the ER with abdominal pain with nausea.  Patient also complained of chest pain and generalized weakness.  Generally unwell.  Patient was seen and evaluated.  She has some nausea but no significant vomiting.  With her known history of chron's disease patient was evaluated with CT abdomen and pelvis.  This shows several segments of fairly significant thickening especially in 8 cm segment of the distal ileum with possible ulceration.  There is also fluid collection along the margin of the loop of bowel which is about 4.9 x 6.5 cm suspicious for an abscess.  GI was consulted and Dr. Georgina Peer recommended admission and he will follow-up.  Patient is otherwise doing better.  No fever or chills.  She is just having 10 out of 10 pain at the moment.  Will admit the patient and initiate treatment with steroids antibiotics and to be seen by GI.  Review of Systems: As mentioned in the history of present illness. All other systems reviewed and are negative. History reviewed. No pertinent past medical history. History reviewed. No pertinent surgical history. Social History:  has no history on file for tobacco use, alcohol use, and drug use.  No Known Allergies  History reviewed. No pertinent family history.  Prior to Admission medications   Not on File    Physical Exam: Vitals:   10/06/22 2030 10/06/22 2045 10/06/22 2106 10/06/22 2212  BP: 100/74 110/76  103/68  Pulse: 79 79  77  Resp: 13 15  17   Temp:   98.3 F (36.8 C) 97.7 F (36.5 C)   TempSrc:   Oral Oral  SpO2: 100% 100%  96%   Constitutional: Chronically ill looking, cachectic, no distress Eyes: PERRL, lids and conjunctivae normal ENMT: Mucous membranes are moist. Posterior pharynx clear of any exudate or lesions.Normal dentition.  Neck: normal, supple, no masses, no thyromegaly Respiratory: clear to auscultation bilaterally, no wheezing, no crackles. Normal respiratory effort. No accessory muscle use.  Cardiovascular: Regular rate and rhythm, no murmurs / rubs / gallops. No extremity edema. 2+ pedal pulses. No carotid bruits.  Abdomen: Diffuse tenderness,, no masses palpated. No hepatosplenomegaly. Bowel sounds positive.  Musculoskeletal: Good range of motion, no joint swelling or tenderness, Skin: no rashes, lesions, ulcers. No induration Neurologic: CN 2-12 grossly intact. Sensation intact, DTR normal. Strength 5/5 in all 4.  Psychiatric: Normal judgment and insight. Alert and oriented x 3.  Depressed mood  Data Reviewed:  CT abdomen pelvis showing findings as described above, potassium 3.4 chloride 96, glucose of 131, creatinine 1.14, albumin is 3.7, white count 11.9 D-dimer 3.48.  Assessment and Plan:  #1 acute flare of Crohn's disease: Patient will be admitted.  Keep NPO.  Pain control.  Initiate Solu-Medrol with empiric treatment with metronidazole.  Defer to GI for further workup.  #2 hypokalemia: Continue to replete.  #3 severe cachexia: Nutrition consult.  #4 AKI: Continue hydration  #5 leukocytosis: Secondary to inflammation.    Advance Care Planning:   Code Status: Full Code   Consults: Dr. Watt Climes  Family Communication:  No family at bedside  Severity of Illness: The appropriate patient status for this patient is INPATIENT. Inpatient status is judged to be reasonable and necessary in order to provide the required intensity of service to ensure the patient's safety. The patient's presenting symptoms, physical exam findings, and initial  radiographic and laboratory data in the context of their chronic comorbidities is felt to place them at high risk for further clinical deterioration. Furthermore, it is not anticipated that the patient will be medically stable for discharge from the hospital within 2 midnights of admission.   * I certify that at the point of admission it is my clinical judgment that the patient will require inpatient hospital care spanning beyond 2 midnights from the point of admission due to high intensity of service, high risk for further deterioration and high frequency of surveillance required.*  AuthorBarbette Merino, MD 10/06/2022 10:43 PM  For on call review [email or web address].

## 2022-10-06 NOTE — ED Notes (Signed)
RN messaged provider for pain meds.

## 2022-10-06 NOTE — ED Provider Triage Note (Signed)
Emergency Medicine Provider Triage Evaluation Note  Sharon [name] , a 42 y.o. female  was evaluated in triage.  Pt complains of abdominal pain, reduced appetite, N/V, chest painx5 days. States started with chest pain and then went to abdominal pain. Denies any cough, sore throat. Reports vomiting 5x/day. Last BM was 2 days ago. Hx of crohns, myeloma. C/o SOB. Is on BC, -BT. Feels very weak and ill.  Review of Systems  Positive: N/V Negative: fever Physical Exam  BP 115/85 (BP Location: Left Arm)   Pulse (!) 113   Temp 98.9 F (37.2 C) (Oral)   Resp 17   SpO2 94%  Gen:   Awake, no distress   Resp:  Normal effort  MSK:   Moves extremities without difficulty  Other:  +chest wall ttp, diffuse abdominal pain, but predominantly umbilical/epigastric  Medical Decision Making  Medically screening exam initiated at 12:50 PM.  Appropriate orders placed.  Legacie Dillingham was informed that the remainder of the evaluation will be completed by another provider, this initial triage assessment does not replace that evaluation, and the importance of remaining in the ED until their evaluation is complete.    Osvaldo Shipper, [place] 10/06/22 1257

## 2022-10-06 NOTE — Progress Notes (Signed)
Pt is refusing to have vital signs checked during the middle of the night. It has been explained to her how often the vital signs are to be checked. She is stating that the next time, she wants them checked is at 10am, tomorrow. She doesn't want to be awakened in the middle of the night, to have vitals checked.

## 2022-10-06 NOTE — ED Notes (Signed)
Report given to Natasha.

## 2022-10-06 NOTE — ED Provider Notes (Signed)
Patient CT scan and shows some significant abnormalities secondary to known Crohn's disease.  Will treat with some IV Solu-Medrol.  Did discuss with Dr. Watt Climes from [facility] who will see patient in consultation.  Will going admit her n.p.o. IV's steroids.  CT scan shows some evidence of several segments of fairly significantly thickening there is an 8 cm segment markedly severe wall thickening distal ileum likely associated with ulceration.  Fluid collection along the margin of the loop of bowel measures about 4.9 by about 6.5 suspicion for small abscess although without internal gas and the abnormal inflammation extends nearly to the ileocecal valve.  Proximally there is a markedly severe inflamed loop there are other inflamed dilated loops of bowel measuring 5.5 cm in diameter have.  Moreover just proximal to the most severely inflamed distal segment there is 2 segment in the lumen Of a structure which could represent and a relief.  No evidence of perforation.  Also there is incidental cholelithiasis.     Fredia Sorrow, MD 10/06/22 2020

## 2022-10-06 NOTE — ED Notes (Signed)
Attempted report 

## 2022-10-06 NOTE — ED Notes (Signed)
Attending messaged for pain meds per pt request. Father now at bedside.

## 2022-10-06 NOTE — ED Triage Notes (Signed)
Pt arrived via POV, c/o chest pain, abd pain, overall feeling generalized weakness and feeling unwell.

## 2022-10-06 NOTE — ED Provider Notes (Signed)
[facility] DEPT Provider Note   CSN: [id number] Arrival date & time: 10/06/22  1235     History  Chief Complaint  Patient presents with   Chest Pain   Abdominal Pain    Sharon [name] is a 42 y.o. female.  Patient complains of abdominal discomfort for 5 days.  Patient has a history of Crohn's disease with previous surgery.  She is hurting epigastric and right lower quadrant.  The history is provided by the patient and medical records.  Abdominal Pain Pain location:  RLQ Pain quality: aching   Pain radiates to:  Does not radiate Pain severity:  Moderate Onset quality:  Sudden Timing:  Constant Progression:  Worsening Chronicity:  New Context: not alcohol use   Associated symptoms: no chest pain, no cough, no diarrhea, no fatigue and no hematuria        Home Medications Prior to Admission medications   Not on File      Allergies    Patient has no known allergies.    Review of Systems   Review of Systems  Constitutional:  Negative for appetite change and fatigue.  HENT:  Negative for congestion, ear discharge and sinus pressure.   Eyes:  Negative for discharge.  Respiratory:  Negative for cough.   Cardiovascular:  Negative for chest pain.  Gastrointestinal:  Positive for abdominal pain. Negative for diarrhea.  Genitourinary:  Negative for frequency and hematuria.  Musculoskeletal:  Negative for back pain.  Skin:  Negative for rash.  Neurological:  Negative for seizures and headaches.  Psychiatric/Behavioral:  Negative for hallucinations.     Physical Exam Updated Vital Signs BP (!) 104/59   Pulse 98   Temp 98.3 F (36.8 C) (Oral)   Resp 20   LMP 08/26/2022 (Approximate) Comment: periods are not normal per patient  SpO2 93%  Physical Exam Vitals and nursing note reviewed.  Constitutional:      Appearance: She is well-developed.  HENT:     Head: Normocephalic.     Mouth/Throat:     Mouth: Mucous membranes are moist.   Eyes:     General: No scleral icterus.    Conjunctiva/sclera: Conjunctivae normal.  Neck:     Thyroid: No thyromegaly.  Cardiovascular:     Rate and Rhythm: Normal rate and regular rhythm.     Heart sounds: No murmur heard.    No friction rub. No gallop.  Pulmonary:     Breath sounds: No stridor. No wheezing or rales.  Chest:     Chest wall: No tenderness.  Abdominal:     General: There is no distension.     Tenderness: There is abdominal tenderness. There is no rebound.  Musculoskeletal:        General: Normal range of motion.     Cervical back: Neck supple.  Lymphadenopathy:     Cervical: No cervical adenopathy.  Skin:    Findings: No erythema or rash.  Neurological:     Mental Status: She is alert and oriented to person, place, and time.     Motor: No abnormal muscle tone.     Coordination: Coordination normal.  Psychiatric:        Behavior: Behavior normal.     ED Results / Procedures / Treatments   Labs (all labs ordered are listed, but only abnormal results are displayed) Labs Reviewed  CBC - Abnormal; Notable for the following components:      Result Value   WBC 11.9 (*)  Platelets 439 (*)    All other components within normal limits  D-DIMER, QUANTITATIVE - Abnormal; Notable for the following components:   D-Dimer, Quant 3.48 (*)    All other components within normal limits  COMPREHENSIVE METABOLIC PANEL - Abnormal; Notable for the following components:   Potassium 3.4 (*)    Chloride 96 (*)    Glucose, Bld 131 (*)    Creatinine, Ser 1.14 (*)    Total Protein 8.5 (*)    Anion gap 16 (*)    All other components within normal limits  RESP PANEL BY RT-PCR (RSV, FLU A&B, COVID)  [id number]  URINALYSIS, ROUTINE W REFLEX MICROSCOPIC  I-STAT BETA HCG BLOOD, ED ([facility], [facility], [facility] ONLY)  TROPONIN I (HIGH SENSITIVITY)  TROPONIN I (HIGH SENSITIVITY)    EKG None  Radiology DG Abdomen 1 View  Result Date: 10/06/2022 CLINICAL DATA:  Nausea and vomiting.  History of  Crohn's disease. EXAM: ABDOMEN - 1 VIEW COMPARISON:  None Available. FINDINGS: Bowel gas pattern is normal without evidence of ileus or obstruction. Anastomotic sutures noted in the right lower quadrant. No abnormal calcifications. Minimal spinal curvature. IMPRESSION: Negative. Electronically Signed   By: Nelson Chimes M.D.   On: 10/06/2022 13:53   DG Chest 2 View  Result Date: 10/06/2022 CLINICAL DATA:  Chest pain, shortness of breath, nausea, and vomiting EXAM: CHEST - 2 VIEW COMPARISON:  None Available. FINDINGS: Normal lung volumes. No focal consolidations. No pleural effusion or pneumothorax. The heart size and mediastinal contours are within normal limits. The visualized skeletal structures are unremarkable. IMPRESSION: No active cardiopulmonary disease. Electronically Signed   By: Darrin Nipper M.D.   On: 10/06/2022 13:52    Procedures Procedures    Medications Ordered in ED Medications  HYDROcodone-acetaminophen (NORCO/VICODIN) 5-325 MG per tablet 1 tablet (1 tablet Oral Given 10/06/22 1323)  ondansetron (ZOFRAN-ODT) disintegrating tablet 4 mg (4 mg Oral Given 10/06/22 1323)  sodium chloride 0.9 % bolus 1,000 mL (1,000 mLs Intravenous New Bag/Given 10/06/22 1602)  morphine (PF) 2 MG/ML injection 2 mg (2 mg Intravenous Given 10/06/22 1602)    ED Course/ Medical Decision Making/ A&P                           Medical Decision Making Amount and/or Complexity of Data Reviewed Radiology: ordered.  Risk Prescription drug management. Decision regarding hospitalization.   Patient with Crohn's disease and she has epigastric and right lower quadrant pain.  We will get a CT scan for evaluation Disposition will be determined by my colleague Dr. Rogene Houston       Final Clinical Impression(s) / ED Diagnoses Final diagnoses:  None    Rx / DC Orders ED Discharge Orders     None         Milton Ferguson, MD 10/09/22 1014

## 2022-10-07 DIAGNOSIS — K50014 Crohn's disease of small intestine with abscess: Secondary | ICD-10-CM | POA: Diagnosis present

## 2022-10-07 DIAGNOSIS — R112 Nausea with vomiting, unspecified: Secondary | ICD-10-CM | POA: Diagnosis present

## 2022-10-07 DIAGNOSIS — E860 Dehydration: Secondary | ICD-10-CM | POA: Diagnosis not present

## 2022-10-07 DIAGNOSIS — E876 Hypokalemia: Secondary | ICD-10-CM | POA: Diagnosis not present

## 2022-10-07 LAB — C-REACTIVE PROTEIN: CRP: 10.4 mg/dL — ABNORMAL HIGH (ref <1.0)

## 2022-10-07 LAB — CBC
HCT: 30.4 % — ABNORMAL LOW (ref 36.0–46.0)
Hemoglobin: 9.7 g/dL — ABNORMAL LOW (ref 12.0–15.0)
MCH: 28.8 pg (ref 26.0–34.0)
MCHC: 31.9 g/dL (ref 30.0–36.0)
MCV: 90.2 fL (ref 80.0–100.0)
Platelets: 312 10*3/uL (ref 150–400)
RBC: 3.37 MIL/uL — ABNORMAL LOW (ref 3.87–5.11)
RDW: 12.8 % (ref 11.5–15.5)
WBC: 4 10*3/uL (ref 4.0–10.5)
nRBC: 0 % (ref 0.0–0.2)

## 2022-10-07 LAB — COMPREHENSIVE METABOLIC PANEL
ALT: 14 U/L (ref 0–44)
AST: 13 U/L — ABNORMAL LOW (ref 15–41)
Albumin: 3.1 g/dL — ABNORMAL LOW (ref 3.5–5.0)
Alkaline Phosphatase: 42 U/L (ref 38–126)
Anion gap: 10 (ref 5–15)
BUN: 14 mg/dL (ref 6–20)
CO2: 27 mmol/L (ref 22–32)
Calcium: 8.7 mg/dL — ABNORMAL LOW (ref 8.9–10.3)
Chloride: 105 mmol/L (ref 98–111)
Creatinine, Ser: 0.92 mg/dL (ref 0.44–1.00)
GFR, Estimated: >60 mL/min (ref >60)
Glucose, Bld: 143 mg/dL — ABNORMAL HIGH (ref 70–99)
Potassium: 3.7 mmol/L (ref 3.5–5.1)
Sodium: 142 mmol/L (ref 135–145)
Total Bilirubin: 0.8 mg/dL (ref 0.3–1.2)
Total Protein: 7.1 g/dL (ref 6.5–8.1)

## 2022-10-07 MED ORDER — HYDROMORPHONE HCL 1 MG/ML IJ SOLN
1.0000 mg | INTRAMUSCULAR | Status: DC | PRN
  Administered 2022-10-07: 1 mg via INTRAVENOUS
  Filled 2022-10-07: qty 1

## 2022-10-07 MED ORDER — POTASSIUM CHLORIDE 2 MEQ/ML IV SOLN
INTRAVENOUS | Status: DC
  Filled 2022-10-07 (×11): qty 1000

## 2022-10-07 MED ORDER — SODIUM CHLORIDE 0.9 % IV SOLN
2.0000 g | Freq: Three times a day (TID) | INTRAVENOUS | Status: DC
  Administered 2022-10-07 – 2022-10-10 (×11): 2 g via INTRAVENOUS
  Filled 2022-10-07 (×12): qty 12.5

## 2022-10-07 MED ORDER — METRONIDAZOLE 500 MG/100ML IV SOLN
500.0000 mg | Freq: Two times a day (BID) | INTRAVENOUS | Status: DC
  Administered 2022-10-07 – 2022-10-10 (×8): 500 mg via INTRAVENOUS
  Filled 2022-10-07 (×8): qty 100

## 2022-10-07 MED ORDER — METHYLPREDNISOLONE SODIUM SUCC 40 MG IJ SOLR
40.0000 mg | Freq: Two times a day (BID) | INTRAMUSCULAR | Status: AC
  Administered 2022-10-07 – 2022-10-10 (×8): 40 mg via INTRAVENOUS
  Filled 2022-10-07 (×8): qty 1

## 2022-10-07 MED ORDER — ONDANSETRON HCL 4 MG/2ML IJ SOLN: 4.0000 mg | Freq: Four times a day (QID) | INTRAMUSCULAR | Status: DC | PRN

## 2022-10-07 MED ORDER — HYDROMORPHONE HCL 1 MG/ML IJ SOLN
0.5000 mg | INTRAMUSCULAR | Status: DC | PRN
  Administered 2022-10-08 – 2022-10-10 (×14): 0.5 mg via INTRAVENOUS
  Filled 2022-10-07 (×14): qty 0.5

## 2022-10-07 MED ORDER — HYDROMORPHONE HCL 1 MG/ML IJ SOLN
1.0000 mg | INTRAMUSCULAR | Status: DC | PRN
  Administered 2022-10-07 – 2022-10-10 (×7): 1 mg via INTRAVENOUS
  Filled 2022-10-07 (×8): qty 1

## 2022-10-07 MED ORDER — ONDANSETRON HCL 4 MG PO TABS: 4.0000 mg | ORAL_TABLET | Freq: Four times a day (QID) | ORAL | Status: DC | PRN

## 2022-10-07 NOTE — Progress Notes (Signed)
PHARMACY NOTE -  Cefepime  Pharmacy has been assisting with dosing of cefepime for intra-abdominal infection. Dosage remains stable at 2g IV q8 hr and further renal adjustments per institutional Pharmacy antibiotic protocol  Pharmacy will sign off, following peripherally for culture results, dose adjustments, and length of therapy. Please reconsult if a change in clinical status warrants re-evaluation of dosage.  Reuel Boom, PharmD, BCPS [phone] 10/07/2022, 9:03 AM

## 2022-10-07 NOTE — Consult Note (Signed)
[facility] Consult  Referring Provider: ER Primary Care Physician:  Patient, No Pcp Per Primary Gastroenterologist: Valeda [facility]  Reason for Consultation: Nausea, vomiting, abdominal pain  HPI: Sharon [name] is a 42 y.o. female states that she was in her usual state of health until about 4 to 5 days ago when she developed nausea, vomiting and abdominal pain and decided to come to the ER. Patient normally has 10-15 bowel movements a day, stools are loose, nonbloody, without mucus, nocturnal diarrhea frequently, for which she takes Imodium.  She took Imodium about 5 days ago, last bowel movement was yesterday afternoon, described as formed, has not had a bowel movement since but is passing flatus. Patient's last dose of Stelara was about 7 weeks ago and she is waiting for an approval to start [employer].   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 She had clinical and endoscopic recurrence in 2012 and was treated with Humira that initially helped. In 2017 she had continued ileal inflammation along with stricture and upstream dilation. In 2018 she lost response to Humira and was switched to Stelara. On Stelara she reports continued diarrhea and abdominal pain and nausea vomiting,stricture and inflammation in the ileum although some imaging suggests the inflammatory component was improved. 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  She has been off of Crohn's therapy through 2023. Over the course of this year she has been treated with prednisone. Most recent colonoscopy  was November 2022 and reports an anastomotic stricture that could not be passed.  CT scan in November 2023 shows ileal thickening and stranding suggestive of active inflammation along with a stricture and proximal dilation.  She has perianal disease that has been treated with setons by Dr. Byrd Hesselbach.  As she has previously failed Remicade and Humira, self discontinued Stelara. Her IBD at [facility], Dr,[place] recommended initiating therapy with Skyrizi. It seems patient refused surgery for her obstructive symptoms.  HISTORY:  Past Medical History:      Past Medical History:  Diagnosis Date   Crohn's disease (HCC)      dx 2001   History of abnormal cervical Pap smear     History of genital warts     Infertility     Pelvic peritoneal adhesions, female     Perirectal abscess      Past Surgical History:       Past Surgical History:  Procedure Laterality Date   bladder perforation       COLONOSCOPY   last one 02-25-2011   LAPAROSCOPIC LYSIS INTESTINAL ADHESIONS       RIGHT HEMICOLECTOMY/ ANASTOMOSIS/  REPAIR ABDOMINAL WALL HERNIA/  RIGHT SALPINOOPHORECTOMY   01/ 2005    CROHN'S    Social History:  reports that she has never smoked. She has never used smokeless tobacco. She reports that she does not drink alcohol and does not use drugs. Family History:       Family History  Problem Relation Age of Onset   Healthy Brother          HOME MEDICATIONS: Allergies as of 03/31/2022   No Known Allergies  Prior to Admission medications   Medication Sig Start Date End Date Taking? Authorizing Provider  Norethin Ace-Eth Estrad-FE (JUNEL FE 24 PO) Take 1 tablet by mouth daily.   Yes [provider]  omeprazole (PRILOSEC) 20 MG capsule Take 20 mg by mouth 2 (two) times daily before a meal.   Yes [provider]  ustekinumab (STELARA) 90 MG/ML SOSY injection Inject 90 mg into the skin every 8 (eight) weeks.   Yes [provider]  sertraline (ZOLOFT) 100 MG tablet  Take 100 mg by mouth 2 (two) times daily. Patient not taking: Reported on 10/07/2022 09/28/22   [provider]    Current Facility-Administered Medications  Medication Dose Route Frequency Provider Last Rate Last Admin   0.9 %  sodium chloride infusion   Intravenous Continuous Fredia Sorrow, MD   Stopped at 10/07/22 0255   ceFEPIme (MAXIPIME) 2 g in sodium chloride 0.9 % 100 mL IVPB  2 g Intravenous Q8H Wofford, Drew A, RPH       HYDROmorphone (DILAUDID) injection 1 mg  1 mg Intravenous Q3H PRN Gala Romney L, MD   1 mg at 10/07/22 0839   HYDROmorphone (DILAUDID) injection 1 mg  1 mg Intravenous Q2H PRN Gala Romney L, MD       lactated ringers 1,000 mL with potassium chloride 20 mEq infusion   Intravenous Continuous Elwyn Reach, MD 125 mL/hr at 10/07/22 0255 New Bag at 10/07/22 0255   methylPREDNISolone sodium succinate (SOLU-MEDROL) 40 mg/mL injection 40 mg  40 mg Intravenous Q12H Gala Romney L, MD   40 mg at 10/07/22 0820   metroNIDAZOLE (FLAGYL) IVPB 500 mg  500 mg Intravenous Q12H Gala Romney L, MD 100 mL/hr at 10/07/22 0114 500 mg at 10/07/22 0114   ondansetron (ZOFRAN) tablet 4 mg  4 mg Oral Q6H PRN Elwyn Reach, MD       Or   ondansetron (ZOFRAN) injection 4 mg  4 mg Intravenous Q6H PRN Elwyn Reach, MD        Allergies as of 10/06/2022   (No Known Allergies)    History reviewed. No pertinent family history.  Social History   Socioeconomic History   Marital status: Married    Spouse name: Not on file   Number of children: Not on file   Years of education: Not on file   Highest education level: Not on file  Occupational History   Not on file  Tobacco Use   Smoking status: Unknown   Smokeless tobacco: Not on file  Substance and Sexual Activity   Alcohol use: Not on file   Drug use: Not on file   Sexual activity: Not on file  Other Topics Concern   Not on file  Social History Narrative   Not on file   Social Determinants of  Health   Financial Resource Strain: Not on file  Food Insecurity: Not on file  Transportation Needs: Not on file  Physical Activity: Not on file  Stress: Not on file  Social Connections: Not on file  Intimate Partner Violence: Not on file    Review of Systems: Positive for: GI: Described in detail in HPI.    Gen: anorexia, fatigue, weakness, malaise, involuntary weight loss, denies any fever, chills, rigors, night sweats  and sleep disorder CV: Denies chest pain, angina, palpitations, syncope, orthopnea, PND, peripheral edema, and claudication. Resp: Denies dyspnea, cough, sputum, wheezing, coughing up blood. GU : Denies urinary burning, blood in urine, urinary frequency, urinary hesitancy,  nocturnal urination, and urinary incontinence. MS: Denies joint pain or swelling.  Denies muscle weakness, cramps, atrophy.  Derm: Denies rash, itching, oral ulcerations, hives, unhealing ulcers.  Psych: Denies depression, anxiety, memory loss, suicidal ideation, hallucinations,  and confusion. Heme: Denies bruising, bleeding, and enlarged lymph nodes. Neuro:  Denies any headaches, dizziness, paresthesias. Endo:  Denies any problems with DM, thyroid, adrenal function.  Physical Exam: Vital signs in last 24 hours: Temp:  [97.7 F (36.5 C)-98.9 F (37.2 C)] 98.4 F (36.9 C) (01/11 0940) Pulse Rate:  [65-113] 65 (01/11 0940) Resp:  [13-20] 17 (01/11 0940) BP: (93-122)/(59-85) 122/72 (01/11 0940) SpO2:  [92 %-100 %] 92 % (01/11 0940) Last BM Date : 10/05/22  General:   Alert,  Well-developed, well-nourished, pleasant and cooperative in NAD Head:  Normocephalic and atraumatic. Eyes:  Sclera clear, no icterus.   Prominent pallor Ears:  Normal auditory acuity. Nose:  No deformity, discharge,  or lesions. Mouth:  No deformity or lesions.  Oropharynx pink & moist. Neck:  Supple; no masses or thyromegaly. Lungs:  Clear throughout to auscultation.   No wheezes, crackles, or rhonchi. No acute  distress. Heart:  Regular rate and rhythm; no murmurs, clicks, rubs,  or gallops. Extremities:  Without clubbing or edema. Neurologic:  Alert and  oriented x4;  grossly normal neurologically. Skin:  Intact without significant lesions or rashes. Psych:  Alert and cooperative. Normal mood and affect. Abdomen:  Soft, right lower quadrant tenderness and nondistended.  Well-healed midline lower abdominal surgical scars.  No masses, hepatosplenomegaly or hernias noted.  Sluggish bowel sounds, without guarding, and without rebound.         Lab Results: Recent Labs    10/06/22 1330 10/07/22 0526  WBC 11.9* 4.0  HGB 12.9 9.7*  HCT 38.9 30.4*  PLT 439* 312   BMET Recent Labs    10/06/22 1330 10/07/22 0526  NA 139 142  K 3.4* 3.7  CL 96* 105  CO2 27 27  GLUCOSE 131* 143*  BUN 13 14  CREATININE 1.14* 0.92  CALCIUM 9.4 8.7*   LFT Recent Labs    10/07/22 0526  PROT 7.1  ALBUMIN 3.1*  AST 13*  ALT 14  ALKPHOS 42  BILITOT 0.8   PT/INR No results for input(s): "LABPROT", "INR" in the last 72 hours.  Studies/Results: CT ABDOMEN PELVIS W CONTRAST  Result Date: 10/06/2022 CLINICAL DATA:  Acute abdominal pain with reduced appetite, nausea and vomiting, and chest pain. History of Crohn's disease. EXAM: CT ABDOMEN AND PELVIS WITH CONTRAST TECHNIQUE: Multidetector CT imaging of the abdomen and pelvis was performed using the standard protocol following bolus administration of intravenous contrast. RADIATION DOSE REDUCTION: This exam was performed according to the departmental dose-optimization program which includes automated exposure control, adjustment of the mA and/or kV according to patient size and/or use of iterative reconstruction technique. CONTRAST:  OMNIPAQUE IOHEXOL 300 MG/ML  SOLN COMPARISON:  Radiographs of 10/06/2022 FINDINGS: Lower chest: Unremarkable Hepatobiliary: Dependent gallstones in the gallbladder, image 34 series 2. No biliary dilatation. No focal hepatic  parenchymal lesion identified. Pancreas: Unremarkable Spleen: Unremarkable Adrenals/Urinary Tract: No significant findings. Stomach/Bowel: There is an 8 cm segment of markedly severe wall thickening in the distal ileum, likely with associated ulceration, surrounding creeping fat, and abnormal stranding in the surrounding mesentery compatible with severe inflammation and likely reflecting severe active Crohn's disease. Fluid collection along the margin of this loop of bowel measures 4.9 by 1.5 by 1.7 cm (volume = 6.5 cm^3) and is  suspicious for a small abscess, although without internal gas density. This abnormal inflammation extends nearly to the ileocecal valve. Proximal to this markedly severely inflamed loop, there are other inflamed and dilated loops of bowel measuring up to 5.4 cm in diameter. Moreover, just proximal to the most severely inflamed distal segment, there is a 2.0 cm intraluminal rim calcified structure on image 71 of series 2 favoring enterolith. No pneumobilia to suggest gallstone ileus. The appendix is not well seen.  No: Lesion identified. Vascular/Lymphatic: Unremarkable Reproductive: Unremarkable Other: Small amount of free pelvic fluid. Musculoskeletal: Small umbilical hernia contains adipose tissue. IMPRESSION: 1. 8 cm segment of markedly severe wall thickening in the distal ileum, likely with associated ulceration, surrounding creeping fat, and abnormal stranding in the surrounding mesentery compatible with severe active Crohn's disease. Severe luminal narrowing in this segment. Fluid collection along the margin of this loop of bowel measures 4.9 by 1.5 by 1.7 cm and is suspicious for a small abscess, although without internal gas density. 2. Proximal to this markedly severely inflamed loop of bowel, there are other inflamed and dilated loops of bowel measuring up to 5.4 cm in diameter. Moreover, there is a 2.0 cm intraluminal rim calcified structure just proximal to the most severely  inflamed loop favoring enterolith. 3. Small amount of free pelvic fluid. 4. Cholelithiasis. 5. Small umbilical hernia contains adipose tissue. Electronically Signed   By: Gaylyn Rong M.D.   On: 10/06/2022 17:26   DG Abdomen 1 View  Result Date: 10/06/2022 CLINICAL DATA:  Nausea and vomiting.  History of Crohn's disease. EXAM: ABDOMEN - 1 VIEW COMPARISON:  None Available. FINDINGS: Bowel gas pattern is normal without evidence of ileus or obstruction. Anastomotic sutures noted in the right lower quadrant. No abnormal calcifications. Minimal spinal curvature. IMPRESSION: Negative. Electronically Signed   By: Paulina Fusi M.D.   On: 10/06/2022 13:53   DG Chest 2 View  Result Date: 10/06/2022 CLINICAL DATA:  Chest pain, shortness of breath, nausea, and vomiting EXAM: CHEST - 2 VIEW COMPARISON:  None Available. FINDINGS: Normal lung volumes. No focal consolidations. No pleural effusion or pneumothorax. The heart size and mediastinal contours are within normal limits. The visualized skeletal structures are unremarkable. IMPRESSION: No active cardiopulmonary disease. Electronically Signed   By: Agustin Cree M.D.   On: 10/06/2022 13:52    Impression: Complicated Crohn's disease 8 cm of markedly severe wall thickening in distal ileum associated with ulceration compatible with severe active Crohn's disease Severe luminal narrowing 4.9 x 1.5 x 1.5 cm abscess 5.4 cm of another bowel segment with inflammation and dilation 2 cm intraluminal rim calcified structure proximal to most severely inflamed loop favoring enterolith  Normocytic anemia Low albumin 3.1  Plan: As per [facility], patient refusing surgical management of strictured ileal segment, with plans to start Lafe once approved. As patient has intra-abdominal abscess, she has been started on cefepime 2 g IV every 8 hours and IV Flagyl 500 mg every 12 hours. For active Crohn's, patient has been started on Solu-Medrol 40 mg  every 12 hours. Had a lengthy discussion with the patient that with stricturing she will most likely benefit from surgical intervention as steroids and Biologics will only help if inflammatory component not a fibro stenotic stricture. Patient refused a rectal exam, requested to take a shower and start clear liquids before she allows a rectal exam. Since there is no evidence of ongoing obstruction, will start patient on clear liquids and okay to allow her  to take a shower today.   LOS: 1 day   Ronnette Juniper, MD  10/07/2022, 10:36 AM

## 2022-10-07 NOTE — Assessment & Plan Note (Addendum)
Replaced. °

## 2022-10-07 NOTE — Hospital Course (Addendum)
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 Koleen Distance with GI and Dr. Morton Stall with general surgery at [facility]) also complicated by chronic abdominal pain (follows wtih Dr. Burton Apley with [facility]).  She is status post ileocecectomy in 2004.  Her last EGD/colonoscopy 07/2001 revealed mild gastritis as well as a stenosed ileocolonic anastomosis in the ascending colon with normal terminal ileum.    Patient presents to [facility] emergency department with complaints of abdominal pain nausea and vomiting.  CT imaging of the abdomen pelvis was performed in the emergency department revealing 8 cm segment of markedly severe wall thickening of the distal ileum associated with ulceration thought to be sequela of her severe active Crohn's disease with a fluid collection along the margin of that loop of bowel measuring 4.9 x 1.5 x 1.7 cm suspicious for small abscess formation.  EDP discussed case with Dr. Watt Climes with [facility] gastroenterology who recommended hospitalization with GI consultation in the morning.  The hospitalist group was then called to assess the patient for admission to the hospital.  Patient was admitted to the hospitalist service and under the guidance of gastroenterology was placed on a combination of systemic steroids and intravenous antibiotic therapy.  Patient was initially kept n.p.o. but in the days that followed gradually had her diet advanced to a clear and then a full liquid diet.  Despite these measures, clinical improvement was slow with patient continuing to have significant abdominal pain.  Unfortunately, patient decided to leave [place] the evening of 10/10/2022.  Patient was advised of the risks of morbidity and mortality with patient voicing her understanding of these increased risks at time of discharge and  signing all appropriate paperwork.  Patient was advised to seek attention with her primary care provider and gastroenterologist to soon as possible and to seek medical attention in local emergency department if she clinically worsened.

## 2022-10-07 NOTE — Assessment & Plan Note (Addendum)
Complicated presentation of stricturing Crohn's disease with numerous surgical interventions Patient reports last dose of Stelara was 7 weeks ago Patient reports that she is awaiting approval to start [employer] [place] On this presentation patient is exhibiting markedly severe wall thickening of the distal ileum with concurrent abscess Transitioning patient from Solu-Medrol to oral prednisone in the morning per GI recommendations Transitioning from intravenous Flagyl and cefepime to oral Augmentin for concurrent abscess to complete a 14-day course of antibiotics Transitioning to full liquid diet CRP downtrending  As needed opiate-based analgesics for substantial associated pain Gastroenterology following along, their input is appreciated.

## 2022-10-07 NOTE — TOC CM/SW Note (Signed)
Transition of Care (TOC) Screening Note  Patient Details  Name: Sharon [name] Date of Birth: 1980/11/18  Transition of Care [facility]) CM/SW Contact:    Sherie Don, LCSW Phone Number: 10/07/2022, 12:17 PM  Transition of Care Department [facility]) has reviewed patient and no TOC needs have been identified at this time. We will continue to monitor patient advancement through interdisciplinary progression rounds. If new patient transition needs arise, please place a TOC consult.

## 2022-10-07 NOTE — Progress Notes (Signed)
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 Koleen Distance with GI and Dr. Morton Stall with general surgery at [facility]) also complicated by chronic abdominal pain (follows wtih Dr. Burton Apley with [facility] [facility]).  She is status post ileocecectomy in 2004.  Her last EGD/colonoscopy 07/2001 revealed mild gastritis as well as a stenosed ileocolonic anastomosis in the ascending colon with normal terminal ileum.    Patient presents to [facility] emergency department with complaints of abdominal pain nausea and vomiting.  CT imaging of the abdomen pelvis was performed in the emergency department revealing 8 cm segment of markedly severe wall thickening of the distal ileum associated with ulceration thought to be sequela of her severe active Crohn's disease with a fluid collection along the margin of that loop of bowel measuring 4.9 x 1.5 x 1.7 cm suspicious for small abscess formation.  EDP discussed case with Dr. Watt Climes with [facility] gastroenterology who recommended hospitalization with GI consultation in the morning.  The hospitalist group was then called to assess the patient for admission to the hospital.   Assessment and Plan: Crohn's disease of ileum with abscess ([place]) Complicated presentation of stricturing Crohn's disease with numerous surgical interventions Patient reports last dose of Stelara was 7 weeks ago Patient reports that she is awaiting approval to start Skyrizi On this presentation patient is exhibiting markedly severe wall thickening of the distal ileum with concurrent abscess Diagnosis supported by markedly elevated CRP Currently on Solu-Medrol for  systemic steroids with intravenous Flagyl and cefepime for management of concurrent abscess As needed opiate-based analgesics for substantial associated pain Dr. Deno Etienne with gastroenterology following along, their input is appreciated.  Intractable nausea and vomiting Patient initially presented with intractable nausea and vomiting secondary to Crohn's flare This resulted in evidence of clinical volume depletion and hypokalemia Hydrating with intravenous isotonic fluids As needed antiemetics Treatment of underlying Crohn's flare  Dehydration Intravenous volume resuscitation   Hypokalemia due to excessive gastrointestinal loss of potassium Replacing with potassium chloride Evaluating for concurrent hypomagnesemia  Monitoring potassium levels with serial chemistries.      Subjective:  Patient continues to complain of abdominal pain, moderate to severe in intensity, sharp in quality, mostly located in the right lower quadrant, worse with movement.  Associated nausea and vomiting have improved.  Patient states that her pain is overall somewhat improved compared to yesterday.  Physical Exam:  Vitals:   10/07/22 0940 10/07/22 1244 10/07/22 1749 10/07/22 1951  BP: 122/72 119/71 110/70 108/74  Pulse: 65 65 62 77  Resp: 17 17 17 16   Temp: 98.4 F (36.9 C) 98.5 F (36.9 C) 98.4 F (36.9 C) 97.6 F (36.4 C)  TempSrc: Oral Oral Oral Oral  SpO2: 92% 92% 96% 100%     Constitutional: Awake alert and oriented x3, no associated distress.   Skin: no rashes, no lesions, good skin turgor noted. Eyes: Pupils are equally reactive to light.  No evidence of scleral icterus or conjunctival pallor.  ENMT: Moist mucous membranes noted.  Posterior pharynx clear of any exudate or lesions.   Respiratory: clear to auscultation bilaterally, no wheezing, no crackles. Normal respiratory effort. No accessory muscle use.  Cardiovascular: Regular rate and rhythm, no murmurs / rubs /  gallops. No extremity  edema. 2+ pedal pulses. No carotid bruits.  Abdomen: Notable General abdominal tenderness, worst in the right lower quadrant.  Abdomen is soft.  Hypoactive bowel sounds noted.  No evidence of intra-abdominal masses.   Musculoskeletal: No joint deformity upper and lower extremities. Good ROM, no contractures. Normal muscle tone.    Data Reviewed:  I have personally reviewed and interpreted labs, imaging.  Significant findings are   CBC: Recent Labs  Lab 10/06/22 1330 10/07/22 0526  WBC 11.9* 4.0  HGB 12.9 9.7*  HCT 38.9 30.4*  MCV 88.0 90.2  PLT 439* 403   Basic Metabolic Panel: Recent Labs  Lab 10/06/22 1330 10/07/22 0526  NA 139 142  K 3.4* 3.7  CL 96* 105  CO2 27 27  GLUCOSE 131* 143*  BUN 13 14  CREATININE 1.14* 0.92  CALCIUM 9.4 8.7*   GFR: CrCl cannot be calculated (Unknown ideal weight.). Liver Function Tests: Recent Labs  Lab 10/06/22 1330 10/07/22 0526  AST 18 13*  ALT 18 14  ALKPHOS 50 42  BILITOT 1.2 0.8  PROT 8.5* 7.1  ALBUMIN 3.7 3.1*      Code Status:  Full code.  Code status decision has been confirmed with: patient    Severity of Illness:  The appropriate patient status for this patient is INPATIENT. Inpatient status is judged to be reasonable and necessary in order to provide the required intensity of service to ensure the patient's safety. The patient's presenting symptoms, physical exam findings, and initial radiographic and laboratory data in the context of their chronic comorbidities is felt to place them at high risk for further clinical deterioration. Furthermore, it is not anticipated that the patient will be medically stable for discharge from the hospital within 2 midnights of admission.   * I certify that at the point of admission it is my clinical judgment that the patient will require inpatient hospital care spanning beyond 2 midnights from the point of admission due to high intensity of service, high risk for further  deterioration and high frequency of surveillance required.*  Time spent:  52 minutes  Author:  Vernelle Emerald MD  10/07/2022 9:34 PM

## 2022-10-07 NOTE — Assessment & Plan Note (Addendum)
Resolved

## 2022-10-07 NOTE — Assessment & Plan Note (Addendum)
Patient initially presented with intractable nausea and vomiting secondary to complicated Crohn's flare Symptoms of nausea vomiting have resolved. Transitioning off of intravenous fluids today. As needed antiemetics Treatment of underlying Crohn's flare

## 2022-10-08 DIAGNOSIS — R112 Nausea with vomiting, unspecified: Secondary | ICD-10-CM | POA: Diagnosis not present

## 2022-10-08 DIAGNOSIS — E876 Hypokalemia: Secondary | ICD-10-CM | POA: Diagnosis not present

## 2022-10-08 DIAGNOSIS — E860 Dehydration: Secondary | ICD-10-CM | POA: Diagnosis not present

## 2022-10-08 DIAGNOSIS — K50014 Crohn's disease of small intestine with abscess: Secondary | ICD-10-CM | POA: Diagnosis not present

## 2022-10-08 LAB — CBC WITH DIFFERENTIAL/PLATELET
Abs Immature Granulocytes: 0.12 10*3/uL — ABNORMAL HIGH (ref 0.00–0.07)
Basophils Absolute: 0 10*3/uL (ref 0.0–0.1)
Basophils Relative: 0 %
Eosinophils Absolute: 0 10*3/uL (ref 0.0–0.5)
Eosinophils Relative: 0 %
HCT: 26.9 % — ABNORMAL LOW (ref 36.0–46.0)
Hemoglobin: 8.7 g/dL — ABNORMAL LOW (ref 12.0–15.0)
Immature Granulocytes: 2 %
Lymphocytes Relative: 15 %
Lymphs Abs: 1 10*3/uL (ref 0.7–4.0)
MCH: 29.3 pg (ref 26.0–34.0)
MCHC: 32.3 g/dL (ref 30.0–36.0)
MCV: 90.6 fL (ref 80.0–100.0)
Monocytes Absolute: 0.4 10*3/uL (ref 0.1–1.0)
Monocytes Relative: 6 %
Neutro Abs: 5.1 10*3/uL (ref 1.7–7.7)
Neutrophils Relative %: 77 %
Platelets: 291 10*3/uL (ref 150–400)
RBC: 2.97 MIL/uL — ABNORMAL LOW (ref 3.87–5.11)
RDW: 12.8 % (ref 11.5–15.5)
WBC: 6.5 10*3/uL (ref 4.0–10.5)
nRBC: 0 % (ref 0.0–0.2)

## 2022-10-08 LAB — COMPREHENSIVE METABOLIC PANEL
ALT: 13 U/L (ref 0–44)
AST: 22 U/L (ref 15–41)
Albumin: 2.9 g/dL — ABNORMAL LOW (ref 3.5–5.0)
Alkaline Phosphatase: 31 U/L — ABNORMAL LOW (ref 38–126)
Anion gap: 10 (ref 5–15)
BUN: 12 mg/dL (ref 6–20)
CO2: 24 mmol/L (ref 22–32)
Calcium: 8.6 mg/dL — ABNORMAL LOW (ref 8.9–10.3)
Chloride: 105 mmol/L (ref 98–111)
Creatinine, Ser: 1 mg/dL (ref 0.44–1.00)
GFR, Estimated: >60 mL/min (ref >60)
Glucose, Bld: 136 mg/dL — ABNORMAL HIGH (ref 70–99)
Potassium: 3.8 mmol/L (ref 3.5–5.1)
Sodium: 139 mmol/L (ref 135–145)
Total Bilirubin: 0.6 mg/dL (ref 0.3–1.2)
Total Protein: 6.1 g/dL — ABNORMAL LOW (ref 6.5–8.1)

## 2022-10-08 LAB — MAGNESIUM: Magnesium: 2.2 mg/dL (ref 1.7–2.4)

## 2022-10-08 LAB — C-REACTIVE PROTEIN: CRP: 3.9 mg/dL — ABNORMAL HIGH (ref <1.0)

## 2022-10-08 LAB — HIV ANTIBODY (ROUTINE TESTING W REFLEX): HIV Screen 4th Generation wRfx: NONREACTIVE

## 2022-10-08 MED ORDER — BOOST / RESOURCE BREEZE PO LIQD CUSTOM
1.0000 | Freq: Three times a day (TID) | ORAL | Status: DC
  Administered 2022-10-08: 1 via ORAL
  Administered 2022-10-10: 267 mL via ORAL

## 2022-10-08 NOTE — Progress Notes (Signed)
Subjective: Patient has not had a bowel movement since Tuesday, 3 days ago. She has been tolerating clear liquids and is not ready for diet to be advanced. She continues to have discomfort in the right lower quadrant.  Objective: Vital signs in last 24 hours: Temp:  [97.6 F (36.4 C)-98.5 F (36.9 C)] 97.6 F (36.4 C) (01/12 0540) Pulse Rate:  [62-77] 63 (01/12 0540) Resp:  [16-17] 16 (01/12 0540) BP: (108-119)/(70-74) 111/71 (01/12 0540) SpO2:  [92 %-100 %] 94 % (01/12 0540) Weight change:  Last BM Date : 10/05/22  PE: Well-nourished, pleasant, cooperative GENERAL: Prominent pallor  ABDOMEN: Soft, nondistended, hypoactive bowel sounds EXTREMITIES: No deformity GI Rectal exam(performed in presence of Sharon [name] her nurse as chaperone): Multiple healed fistulae, 1 seton in place, normal sphincter tone, empty rectal vault, no obvious fluctuation indicated of of perirectal fistula, no discharge  Lab Results: Results for orders placed or performed during the hospital encounter of 10/06/22 (from the past 48 hour(s))  CBC     Status: Abnormal   Collection Time: 10/06/22  1:30 PM  Result Value Ref Range   WBC 11.9 (H) 4.0 - 10.5 K/uL   RBC 4.42 3.87 - 5.11 MIL/uL   Hemoglobin 12.9 12.0 - 15.0 g/dL   HCT 38.9 36.0 - 46.0 %   MCV 88.0 80.0 - 100.0 fL   MCH 29.2 26.0 - 34.0 pg   MCHC 33.2 30.0 - 36.0 g/dL   RDW 12.7 11.5 - 15.5 %   Platelets 439 (H) 150 - 400 K/uL   nRBC 0.0 0.0 - 0.2 %    Comment: Performed at [facility], [place] [address] [place], [place] [address]  Troponin I (High Sensitivity)     Status: None   Collection Time: 10/06/22  1:30 PM  Result Value Ref Range   Troponin I (High Sensitivity) <2 <18 ng/L    Comment: (NOTE) Elevated high sensitivity troponin I (hsTnI) values and significant  changes across serial measurements may suggest ACS but many other  chronic and acute conditions are known to elevate hsTnI results.  Refer to the "Links" section for  chest pain algorithms and additional  guidance. Performed at [facility], [place] [address], [place], [place] [address]   D-dimer, quantitative     Status: Abnormal   Collection Time: 10/06/22  1:30 PM  Result Value Ref Range   D-Dimer, Quant 3.48 (H) 0.00 - 0.50 ug/mL-FEU    Comment: (NOTE) At the manufacturer cut-off value of 0.5 g/mL FEU, this assay has a negative predictive value of 95-100%.This assay is intended for use in conjunction with a clinical pretest probability (PTP) assessment model to exclude pulmonary embolism (PE) and deep venous thrombosis (DVT) in outpatients suspected of PE or DVT. Results should be correlated with clinical presentation. Performed at [facility], [place] [address], [place], [place] [address]   Comprehensive metabolic panel     Status: Abnormal   Collection Time: 10/06/22  1:30 PM  Result Value Ref Range   Sodium 139 135 - 145 mmol/L   Potassium 3.4 (L) 3.5 - 5.1 mmol/L   Chloride 96 (L) 98 - 111 mmol/L   CO2 27 22 - 32 mmol/L   Glucose, Bld 131 (H) 70 - 99 mg/dL    Comment: Glucose reference range applies only to samples taken after fasting for at least 8 hours.   BUN 13 6 - 20 mg/dL   Creatinine, Ser 1.14 (H) 0.44 - 1.00 mg/dL  Calcium 9.4 8.9 - 10.3 mg/dL   Total Protein 8.5 (H) 6.5 - 8.1 g/dL   Albumin 3.7 3.5 - 5.0 g/dL   AST 18 15 - 41 U/L   ALT 18 0 - 44 U/L   Alkaline Phosphatase 50 38 - 126 U/L   Total Bilirubin 1.2 0.3 - 1.2 mg/dL   GFR, Estimated >19 >37 mL/min    Comment: (NOTE) Calculated using the CKD-EPI Creatinine Equation (2021)    Anion gap 16 (H) 5 - 15    Comment: Performed at [facility], [address], [place], [place] [address]  Lipase, blood     Status: Abnormal   Collection Time: 10/06/22  1:30 PM  Result Value Ref Range   Lipase 62 (H) 11 - 51 U/L    Comment: Performed at [facility], [address] [place], [place] [address]   I-Stat Beta hCG blood, ED ([facility], [facility], [facility] only)     Status: None   Collection Time: 10/06/22  1:41 PM  Result Value Ref Range   I-stat hCG, quantitative <5.0 <5 mIU/mL   Comment 3            Comment:   GEST. AGE      CONC.  (mIU/mL)   <=1 WEEK        5 - 50     2 WEEKS       50 - 500     3 WEEKS       100 - 10,000     4 WEEKS     1,000 - 30,000        FEMALE AND NON-PREGNANT FEMALE:     LESS THAN 5 mIU/mL   Resp panel by RT-PCR (RSV, Flu A&B, Covid) Anterior Nasal Swab     Status: None   Collection Time: 10/06/22  2:10 PM   Specimen: Anterior Nasal Swab  Result Value Ref Range   SARS Coronavirus 2 by RT PCR NEGATIVE NEGATIVE    Comment: (NOTE) SARS-CoV-2 target nucleic acids are NOT DETECTED.  The SARS-CoV-2 RNA is generally detectable in upper respiratory specimens during the acute phase of infection. The lowest concentration of SARS-CoV-2 viral copies this assay can detect is 138 copies/mL. A negative result does not preclude SARS-Cov-2 infection and should not be used as the sole basis for treatment or other patient management decisions. A negative result may occur with  improper specimen collection/handling, submission of specimen other than nasopharyngeal swab, presence of viral mutation(s) within the areas targeted by this assay, and inadequate number of viral copies(<138 copies/mL). A negative result must be combined with clinical observations, patient history, and epidemiological information. The expected result is Negative.  Fact Sheet for Patients:  BloggerCourse.com  Fact Sheet for Healthcare Providers:  SeriousBroker.it  This test is no t yet approved or cleared by the [place] FDA and  has been authorized for detection and/or diagnosis of SARS-CoV-2 by FDA under an Emergency Use Authorization (EUA). This EUA will remain  in effect (meaning this test can be used) for the duration of the COVID-19 declaration  under Section 564(b)(1) of the Act, 21 U.S.C.section 360bbb-3(b)(1), unless the authorization is terminated  or revoked sooner.       Influenza A by PCR NEGATIVE NEGATIVE   Influenza B by PCR NEGATIVE NEGATIVE    Comment: (NOTE) The Xpert Xpress SARS-CoV-2/FLU/RSV plus assay is intended as an aid in the diagnosis of influenza from Nasopharyngeal swab specimens and should not be  used as a sole basis for treatment. Nasal washings and aspirates are unacceptable for Xpert Xpress SARS-CoV-2/FLU/RSV testing.  Fact Sheet for Patients: BloggerCourse.com  Fact Sheet for Healthcare Providers: SeriousBroker.it  This test is not yet approved or cleared by the [place] FDA and has been authorized for detection and/or diagnosis of SARS-CoV-2 by FDA under an Emergency Use Authorization (EUA). This EUA will remain in effect (meaning this test can be used) for the duration of the COVID-19 declaration under Section 564(b)(1) of the Act, 21 U.S.C. section 360bbb-3(b)(1), unless the authorization is terminated or revoked.     Resp Syncytial Virus by PCR NEGATIVE NEGATIVE    Comment: (NOTE) Fact Sheet for Patients: BloggerCourse.com  Fact Sheet for Healthcare Providers: SeriousBroker.it  This test is not yet approved or cleared by the [place] FDA and has been authorized for detection and/or diagnosis of SARS-CoV-2 by FDA under an Emergency Use Authorization (EUA). This EUA will remain in effect (meaning this test can be used) for the duration of the COVID-19 declaration under Section 564(b)(1) of the Act, 21 U.S.C. section 360bbb-3(b)(1), unless the authorization is terminated or revoked.  Performed at [facility], [address], [place], [place] [address]   Urinalysis, Routine w reflex microscopic Urine, Clean Catch     Status: Abnormal   Collection Time:  10/06/22  4:10 PM  Result Value Ref Range   Color, Urine YELLOW YELLOW   APPearance HAZY (A) CLEAR   Specific Gravity, Urine 1.018 1.005 - 1.030   pH 5.0 5.0 - 8.0   Glucose, UA NEGATIVE NEGATIVE mg/dL   Hgb urine dipstick NEGATIVE NEGATIVE   Bilirubin Urine NEGATIVE NEGATIVE   Ketones, ur NEGATIVE NEGATIVE mg/dL   Protein, ur NEGATIVE NEGATIVE mg/dL   Nitrite NEGATIVE NEGATIVE   Leukocytes,Ua TRACE (A) NEGATIVE   RBC / HPF 0-5 0 - 5 RBC/hpf   WBC, UA 6-10 0 - 5 WBC/hpf   Bacteria, UA RARE (A) NONE SEEN   Squamous Epithelial / HPF 11-20 0 - 5 /HPF   Mucus PRESENT    Budding Yeast PRESENT    Hyaline Casts, UA PRESENT     Comment: Performed at [facility], [address] [place], [place] [address]  HIV Antibody (routine testing w rflx)     Status: None   Collection Time: 10/07/22  5:26 AM  Result Value Ref Range   HIV Screen 4th Generation wRfx Non Reactive Non Reactive    Comment: (NOTE) HIV Negative HIV-1/HIV-2 antibodies and HIV-1 p24 antigen were NOT detected. There is no laboratory evidence of HIV infection. Performed At: [facility] [address] [place], [place] [id number] Jolene Schimke MD IZ:[id number]   Comprehensive metabolic panel     Status: Abnormal   Collection Time: 10/07/22  5:26 AM  Result Value Ref Range   Sodium 142 135 - 145 mmol/L   Potassium 3.7 3.5 - 5.1 mmol/L   Chloride 105 98 - 111 mmol/L   CO2 27 22 - 32 mmol/L   Glucose, Bld 143 (H) 70 - 99 mg/dL    Comment: Glucose reference range applies only to samples taken after fasting for at least 8 hours.   BUN 14 6 - 20 mg/dL   Creatinine, Ser 3.82 0.44 - 1.00 mg/dL   Calcium 8.7 (L) 8.9 - 10.3 mg/dL   Total Protein 7.1 6.5 - 8.1 g/dL   Albumin 3.1 (L) 3.5 - 5.0 g/dL   AST 13 (L) 15 - 41 U/L  ALT 14 0 - 44 U/L   Alkaline Phosphatase 42 38 - 126 U/L   Total Bilirubin 0.8 0.3 - 1.2 mg/dL   GFR, Estimated >09 >98 mL/min    Comment: (NOTE) Calculated using the CKD-EPI  Creatinine Equation (2021)    Anion gap 10 5 - 15    Comment: Performed at [facility], [address] [place], [place] [address]  CBC     Status: Abnormal   Collection Time: 10/07/22  5:26 AM  Result Value Ref Range   WBC 4.0 4.0 - 10.5 K/uL   RBC 3.37 (L) 3.87 - 5.11 MIL/uL   Hemoglobin 9.7 (L) 12.0 - 15.0 g/dL   HCT 05.3 (L) 97.6 - 73.4 %   MCV 90.2 80.0 - 100.0 fL   MCH 28.8 26.0 - 34.0 pg   MCHC 31.9 30.0 - 36.0 g/dL   RDW 19.3 79.0 - 24.0 %   Platelets 312 150 - 400 K/uL   nRBC 0.0 0.0 - 0.2 %    Comment: Performed at [facility], [address], [place], [place] [address]  C-reactive protein     Status: Abnormal   Collection Time: 10/07/22  5:26 AM  Result Value Ref Range   CRP 10.4 (H) <1.0 mg/dL    Comment: Performed at [facility], [address] [place], [place] [address]  Comprehensive metabolic panel     Status: Abnormal   Collection Time: 10/08/22  9:30 AM  Result Value Ref Range   Sodium 139 135 - 145 mmol/L   Potassium 3.8 3.5 - 5.1 mmol/L   Chloride 105 98 - 111 mmol/L   CO2 24 22 - 32 mmol/L   Glucose, Bld 136 (H) 70 - 99 mg/dL    Comment: Glucose reference range applies only to samples taken after fasting for at least 8 hours.   BUN 12 6 - 20 mg/dL   Creatinine, Ser 2.68 0.44 - 1.00 mg/dL   Calcium 8.6 (L) 8.9 - 10.3 mg/dL   Total Protein 6.1 (L) 6.5 - 8.1 g/dL   Albumin 2.9 (L) 3.5 - 5.0 g/dL   AST 22 15 - 41 U/L   ALT 13 0 - 44 U/L   Alkaline Phosphatase 31 (L) 38 - 126 U/L   Total Bilirubin 0.6 0.3 - 1.2 mg/dL   GFR, Estimated >34 >19 mL/min    Comment: (NOTE) Calculated using the CKD-EPI Creatinine Equation (2021)    Anion gap 10 5 - 15    Comment: Performed at [facility], [address] [place], [place] [address]  CBC with Differential/Platelet     Status: Abnormal   Collection Time: 10/08/22  9:30 AM  Result Value Ref Range   WBC 6.5 4.0 - 10.5 K/uL   RBC 2.97 (L) 3.87 -  5.11 MIL/uL   Hemoglobin 8.7 (L) 12.0 - 15.0 g/dL   HCT 79.8 (L) 92.1 - 19.4 %   MCV 90.6 80.0 - 100.0 fL   MCH 29.3 26.0 - 34.0 pg   MCHC 32.3 30.0 - 36.0 g/dL   RDW 17.4 08.1 - 44.8 %   Platelets 291 150 - 400 K/uL   nRBC 0.0 0.0 - 0.2 %   Neutrophils Relative % 77 %   Neutro Abs 5.1 1.7 - 7.7 K/uL   Lymphocytes Relative 15 %   Lymphs Abs 1.0 0.7 - 4.0 K/uL   Monocytes Relative 6 %   Monocytes Absolute 0.4 0.1 - 1.0 K/uL  Eosinophils Relative 0 %   Eosinophils Absolute 0.0 0.0 - 0.5 K/uL   Basophils Relative 0 %   Basophils Absolute 0.0 0.0 - 0.1 K/uL   Immature Granulocytes 2 %   Abs Immature Granulocytes 0.12 (H) 0.00 - 0.07 K/uL    Comment: Performed at [facility], [address], [place], [place] [address]  Magnesium     Status: None   Collection Time: 10/08/22  9:30 AM  Result Value Ref Range   Magnesium 2.2 1.7 - 2.4 mg/dL    Comment: Performed at [facility], [address] [place], [place] [address]    Studies/Results: CT ABDOMEN PELVIS W CONTRAST  Result Date: 10/06/2022 CLINICAL DATA:  Acute abdominal pain with reduced appetite, nausea and vomiting, and chest pain. History of Crohn's disease. EXAM: CT ABDOMEN AND PELVIS WITH CONTRAST TECHNIQUE: Multidetector CT imaging of the abdomen and pelvis was performed using the standard protocol following bolus administration of intravenous contrast. RADIATION DOSE REDUCTION: This exam was performed according to the departmental dose-optimization program which includes automated exposure control, adjustment of the mA and/or kV according to patient size and/or use of iterative reconstruction technique. CONTRAST:  OMNIPAQUE IOHEXOL 300 MG/ML  SOLN COMPARISON:  Radiographs of 10/06/2022 FINDINGS: Lower chest: Unremarkable Hepatobiliary: Dependent gallstones in the gallbladder, image 34 series 2. No biliary dilatation. No focal hepatic parenchymal lesion identified. Pancreas: Unremarkable  Spleen: Unremarkable Adrenals/Urinary Tract: No significant findings. Stomach/Bowel: There is an 8 cm segment of markedly severe wall thickening in the distal ileum, likely with associated ulceration, surrounding creeping fat, and abnormal stranding in the surrounding mesentery compatible with severe inflammation and likely reflecting severe active Crohn's disease. Fluid collection along the margin of this loop of bowel measures 4.9 by 1.5 by 1.7 cm (volume = 6.5 cm^3) and is suspicious for a small abscess, although without internal gas density. This abnormal inflammation extends nearly to the ileocecal valve. Proximal to this markedly severely inflamed loop, there are other inflamed and dilated loops of bowel measuring up to 5.4 cm in diameter. Moreover, just proximal to the most severely inflamed distal segment, there is a 2.0 cm intraluminal rim calcified structure on image 71 of series 2 favoring enterolith. No pneumobilia to suggest gallstone ileus. The appendix is not well seen.  No: Lesion identified. Vascular/Lymphatic: Unremarkable Reproductive: Unremarkable Other: Small amount of free pelvic fluid. Musculoskeletal: Small umbilical hernia contains adipose tissue. IMPRESSION: 1. 8 cm segment of markedly severe wall thickening in the distal ileum, likely with associated ulceration, surrounding creeping fat, and abnormal stranding in the surrounding mesentery compatible with severe active Crohn's disease. Severe luminal narrowing in this segment. Fluid collection along the margin of this loop of bowel measures 4.9 by 1.5 by 1.7 cm and is suspicious for a small abscess, although without internal gas density. 2. Proximal to this markedly severely inflamed loop of bowel, there are other inflamed and dilated loops of bowel measuring up to 5.4 cm in diameter. Moreover, there is a 2.0 cm intraluminal rim calcified structure just proximal to the most severely inflamed loop favoring enterolith. 3. Small amount of free  pelvic fluid. 4. Cholelithiasis. 5. Small umbilical hernia contains adipose tissue. Electronically Signed   By: Gaylyn Rong M.D.   On: 10/06/2022 17:26   DG Abdomen 1 View  Result Date: 10/06/2022 CLINICAL DATA:  Nausea and vomiting.  History of Crohn's disease. EXAM: ABDOMEN - 1 VIEW COMPARISON:  None Available. FINDINGS: Bowel gas pattern is normal without evidence  of ileus or obstruction. Anastomotic sutures noted in the right lower quadrant. No abnormal calcifications. Minimal spinal curvature. IMPRESSION: Negative. Electronically Signed   By: Nelson Chimes M.D.   On: 10/06/2022 13:53   DG Chest 2 View  Result Date: 10/06/2022 CLINICAL DATA:  Chest pain, shortness of breath, nausea, and vomiting EXAM: CHEST - 2 VIEW COMPARISON:  None Available. FINDINGS: Normal lung volumes. No focal consolidations. No pleural effusion or pneumothorax. The heart size and mediastinal contours are within normal limits. The visualized skeletal structures are unremarkable. IMPRESSION: No active cardiopulmonary disease. Electronically Signed   By: Darrin Nipper M.D.   On: 10/06/2022 13:52    Medications: I have reviewed the patient's current medications.  Assessment: Complicated Crohn's disease 8 cm severe wall thickening in distal ileum with ulceration compatible with severe active Crohn's Severe luminal narrowing 4.9 x 1.5 x 1.7 cm abscess Dilated small bowel loop measuring up to 5.4 cm Elevated CRP of 10.4   Normocytic anemia Malnutrition, albumin 2.9, total protein 6.1  Plan: Continue clear liquid diet, continue IV cefepime and IV Flagyl Continue IV Solu-Medrol 40 mg every 12 hours Continue IV fluids at 125 mill per hour Abdominal x-ray in a.m. tomorrow   Ronnette Juniper, MD 10/08/2022, 11:28 AM

## 2022-10-08 NOTE — Progress Notes (Signed)
Pt unhooked her IV from her left [facility] and took a shower. Pt called nurse station to "be hooked back up". When I walked in patients room the IV was beeping and iv fluids were all over the floor. Pt had both unhooked and hooked herself back to the IV. I informed patient not to do that again,and educated her on the potential risks for infection as well as losing her medications that were infusing. Patient apologized and said she needed to take an "urgent shower".

## 2022-10-08 NOTE — Progress Notes (Signed)
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 Koleen Distance with GI and Dr. Morton Stall with general surgery at [facility]) also complicated by chronic abdominal pain (follows wtih Dr. Burton Apley with [place] pain [facility]).  She is status post ileocecectomy in 2004.  Her last EGD/colonoscopy 07/2001 revealed mild gastritis as well as a stenosed ileocolonic anastomosis in the ascending colon with normal terminal ileum.    Patient presents to [facility] emergency department with complaints of abdominal pain nausea and vomiting.  CT imaging of the abdomen pelvis was performed in the emergency department revealing 8 cm segment of markedly severe wall thickening of the distal ileum associated with ulceration thought to be sequela of her severe active Crohn's disease with a fluid collection along the margin of that loop of bowel measuring 4.9 x 1.5 x 1.7 cm suspicious for small abscess formation.  EDP discussed case with Dr. Watt Climes with [facility] gastroenterology who recommended hospitalization with GI consultation in the morning.  The hospitalist group was then called to assess the patient for admission to the hospital.   Assessment and Plan: Crohn's disease of ileum with abscess ([place]) Complicated presentation of stricturing Crohn's disease with numerous surgical interventions Patient reports last dose of Stelara was 7 weeks ago Patient reports that she is awaiting approval to start Skyrizi On this presentation patient is exhibiting markedly severe wall thickening of the distal ileum with concurrent abscess Continuing treatment with intravenous Solu-Medrol as well as intravenous Flagyl  and cefepime for management of concurrent abscess CRP downtrending substantially As needed opiate-based analgesics for substantial associated pain Dr. Deno Etienne with gastroenterology following along, their input is appreciated.  Intractable nausea and vomiting Patient initially presented with intractable nausea and vomiting secondary to Crohn's flare Symptoms have substantially improved, patient is now tolerating some clear liquids Hydrating with intravenous isotonic fluids As needed antiemetics Treatment of underlying Crohn's flare  Dehydration Intravenous volume resuscitation, fluids will now be decreased substantially to 75 cc an hour.   Hypokalemia due to excessive gastrointestinal loss of potassium Replaced     Subjective:  Patient continues to complain of abdominal pain, severe in intensity, diffuse but worse in the right lower quadrant.  Patient reports that her pain while severe is somewhat improved compared to yesterday.  Patient states that she is able to tolerate some clear liquid diet thus far.  Physical Exam:  Vitals:   10/07/22 1749 10/07/22 1951 10/08/22 0540 10/08/22 1232  BP: 110/70 108/74 111/71 115/83  Pulse: 62 77 63 (!) 52  Resp: 17 16 16 17   Temp: 98.4 F (36.9 C) 97.6 F (36.4 C) 97.6 F (36.4 C) 97.6 F (36.4 C)  TempSrc: Oral Oral Oral Oral  SpO2: 96% 100% 94% 97%     Constitutional: Awake alert and oriented x3, in mild distress due to abdominal pain. Skin: no rashes, no lesions, good skin turgor noted. Eyes: Pupils are equally reactive to light.  No evidence of scleral icterus or conjunctival pallor.  ENMT: Moist mucous membranes noted.  Posterior pharynx clear of any exudate or lesions.   Respiratory: clear to auscultation bilaterally, no wheezing, no crackles. Normal respiratory effort. No accessory muscle use.  Cardiovascular: Regular rate and rhythm, no murmurs / rubs /  gallops. No extremity edema. 2+ pedal pulses. No carotid bruits.   Abdomen: Notable General abdominal tenderness, worst in the right lower quadrant.  Abdomen is soft.  Hypoactive bowel sounds noted.  No evidence of intra-abdominal masses.   Musculoskeletal: No joint deformity upper and lower extremities. Good ROM, no contractures. Normal muscle tone.    Data Reviewed:  I have personally reviewed and interpreted labs, imaging.  Significant findings are   CBC: Recent Labs  Lab 10/06/22 1330 10/07/22 0526 10/08/22 0930  WBC 11.9* 4.0 6.5  NEUTROABS  --   --  5.1  HGB 12.9 9.7* 8.7*  HCT 38.9 30.4* 26.9*  MCV 88.0 90.2 90.6  PLT 439* 312 017   Basic Metabolic Panel: Recent Labs  Lab 10/06/22 1330 10/07/22 0526 10/08/22 0930  NA 139 142 139  K 3.4* 3.7 3.8  CL 96* 105 105  CO2 27 27 24   GLUCOSE 131* 143* 136*  BUN 13 14 12   CREATININE 1.14* 0.92 1.00  CALCIUM 9.4 8.7* 8.6*  MG  --   --  2.2   GFR: CrCl cannot be calculated (Unknown ideal weight.). Liver Function Tests: Recent Labs  Lab 10/06/22 1330 10/07/22 0526 10/08/22 0930  AST 18 13* 22  ALT 18 14 13   ALKPHOS 50 42 31*  BILITOT 1.2 0.8 0.6  PROT 8.5* 7.1 6.1*  ALBUMIN 3.7 3.1* 2.9*      Code Status:  Full code.  Code status decision has been confirmed with: patient    Severity of Illness:  The appropriate patient status for this patient is INPATIENT. Inpatient status is judged to be reasonable and necessary in order to provide the required intensity of service to ensure the patient's safety. The patient's presenting symptoms, physical exam findings, and initial radiographic and laboratory data in the context of their chronic comorbidities is felt to place them at high risk for further clinical deterioration. Furthermore, it is not anticipated that the patient will be medically stable for discharge from the hospital within 2 midnights of admission.   * I certify that at the point of admission it is my clinical judgment that the patient will require inpatient hospital  care spanning beyond 2 midnights from the point of admission due to high intensity of service, high risk for further deterioration and high frequency of surveillance required.*  Time spent:  35 minutes  Author:  Vernelle Emerald MD  10/08/2022 8:21 PM

## 2022-10-09 ENCOUNTER — Inpatient Hospital Stay (HOSPITAL_COMMUNITY): Payer: Medicare Other

## 2022-10-09 DIAGNOSIS — E538 Deficiency of other specified B group vitamins: Secondary | ICD-10-CM | POA: Diagnosis present

## 2022-10-09 DIAGNOSIS — R112 Nausea with vomiting, unspecified: Secondary | ICD-10-CM | POA: Diagnosis not present

## 2022-10-09 DIAGNOSIS — E876 Hypokalemia: Secondary | ICD-10-CM | POA: Diagnosis not present

## 2022-10-09 DIAGNOSIS — K50014 Crohn's disease of small intestine with abscess: Secondary | ICD-10-CM | POA: Diagnosis not present

## 2022-10-09 DIAGNOSIS — E860 Dehydration: Secondary | ICD-10-CM | POA: Diagnosis not present

## 2022-10-09 LAB — COMPREHENSIVE METABOLIC PANEL
ALT: 14 U/L (ref 0–44)
AST: 13 U/L — ABNORMAL LOW (ref 15–41)
Albumin: 2.9 g/dL — ABNORMAL LOW (ref 3.5–5.0)
Alkaline Phosphatase: 35 U/L — ABNORMAL LOW (ref 38–126)
Anion gap: 8 (ref 5–15)
BUN: 10 mg/dL (ref 6–20)
CO2: 23 mmol/L (ref 22–32)
Calcium: 8.5 mg/dL — ABNORMAL LOW (ref 8.9–10.3)
Chloride: 107 mmol/L (ref 98–111)
Creatinine, Ser: 0.86 mg/dL (ref 0.44–1.00)
GFR, Estimated: >60 mL/min (ref >60)
Glucose, Bld: 124 mg/dL — ABNORMAL HIGH (ref 70–99)
Potassium: 4.3 mmol/L (ref 3.5–5.1)
Sodium: 138 mmol/L (ref 135–145)
Total Bilirubin: 0.5 mg/dL (ref 0.3–1.2)
Total Protein: 6.4 g/dL — ABNORMAL LOW (ref 6.5–8.1)

## 2022-10-09 LAB — IRON AND TIBC
Iron: 56 ug/dL (ref 28–170)
Saturation Ratios: 25 % (ref 10.4–31.8)
TIBC: 221 ug/dL — ABNORMAL LOW (ref 250–450)
UIBC: 165 ug/dL

## 2022-10-09 LAB — FOLATE: Folate: 5.9 ng/mL — ABNORMAL LOW (ref >5.9)

## 2022-10-09 LAB — PROTIME-INR
INR: 1.2 (ref 0.8–1.2)
Prothrombin Time: 15.2 seconds (ref 11.4–15.2)

## 2022-10-09 LAB — VITAMIN B12: Vitamin B-12: 2161 pg/mL — ABNORMAL HIGH (ref 180–914)

## 2022-10-09 LAB — MAGNESIUM: Magnesium: 2.1 mg/dL (ref 1.7–2.4)

## 2022-10-09 LAB — TYPE AND SCREEN
ABO/RH(D): A POS
Antibody Screen: NEGATIVE

## 2022-10-09 LAB — APTT: aPTT: 25 seconds (ref 24–36)

## 2022-10-09 LAB — ABO/RH: ABO/RH(D): A POS

## 2022-10-09 MED ORDER — FOLIC ACID 1 MG PO TABS
1.0000 mg | ORAL_TABLET | Freq: Every day | ORAL | Status: DC
  Administered 2022-10-10: 1 mg via ORAL
  Filled 2022-10-09: qty 1

## 2022-10-09 MED ORDER — SODIUM CHLORIDE 0.9 % IV SOLN
1.0000 mg | Freq: Once | INTRAVENOUS | Status: AC
  Administered 2022-10-09: 1 mg via INTRAVENOUS
  Filled 2022-10-09: qty 0.2

## 2022-10-09 NOTE — Progress Notes (Signed)
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 Koleen Distance with GI and Dr. Morton Stall with general surgery at [facility]) also complicated by chronic abdominal pain (follows wtih Dr. Burton Apley with [place] pain management).  She is status post ileocecectomy in 2004.  Her last EGD/colonoscopy 07/2001 revealed mild gastritis as well as a stenosed ileocolonic anastomosis in the ascending colon with normal terminal ileum.    Patient presents to [facility] emergency department with complaints of abdominal pain nausea and vomiting.  CT imaging of the abdomen pelvis was performed in the emergency department revealing 8 cm segment of markedly severe wall thickening of the distal ileum associated with ulceration thought to be sequela of her severe active Crohn's disease with a fluid collection along the margin of that loop of bowel measuring 4.9 x 1.5 x 1.7 cm suspicious for small abscess formation.  EDP discussed case with Dr. Watt Climes with [facility] gastroenterology who recommended hospitalization with GI consultation in the morning.  The hospitalist group was then called to assess the patient for admission to the hospital.   Assessment and Plan: Crohn's disease of ileum with abscess ([place]) Complicated presentation of stricturing Crohn's disease with numerous surgical interventions Patient reports last dose of Stelara was 7 weeks ago Patient reports that she is awaiting approval to start Skyrizi On this presentation patient is exhibiting markedly severe wall thickening of the distal ileum with concurrent abscess Continuing treatment with intravenous Solu-Medrol as well as intravenous Flagyl  and cefepime for management of concurrent abscess Continuing clear liquid diet for now CRP downtrending substantially As needed opiate-based analgesics for substantial associated pain Gastroenterology following along, their input is appreciated.  Intractable nausea and vomiting Patient initially presented with intractable nausea and vomiting secondary to complicated Crohn's flare with abscess formation Symptoms have substantially improved, patient is now tolerating some clear liquids Hydrating with intravenous isotonic fluids As needed antiemetics Treatment of underlying Crohn's flare  Dehydration Continue reduced rate of fluids at 75 cc an hour.   Hypokalemia due to excessive gastrointestinal loss of potassium Replaced   Folic acid deficiency Intravenous folic acid followed by daily folic acid tablets.    Subjective:  Patient reports her pain has improved since yesterday.  Patient's pain is sharp in quality, moderate in intensity, located in the lower abdomen and nonradiating.  Patient reports tolerating a good amount of her clear liquid diet today.  Physical Exam:  Vitals:   10/08/22 2158 10/09/22 0508 10/09/22 1237 10/09/22 2120  BP: 123/73 (!) 137/91 (!) 141/82 (!) 141/81  Pulse: (!) 42 (!) 45 (!) 42 (!) 41  Resp: 16 16 16 16   Temp: 97.8 F (36.6 C) 98.1 F (36.7 C) 98.3 F (36.8 C) 97.7 F (36.5 C)  TempSrc: Oral Oral Oral Oral  SpO2: 97% 98% 97% 98%     Constitutional: Awake alert and oriented x3, in mild distress due to abdominal pain. Skin: no rashes, no lesions, good skin turgor noted. Eyes: Pupils are equally reactive to light.  No evidence of scleral icterus or conjunctival pallor.  ENMT: Moist mucous membranes noted.  Posterior pharynx clear of any exudate or lesions.   Respiratory: clear to auscultation bilaterally, no wheezing, no crackles. Normal respiratory effort. No accessory  muscle use.  Cardiovascular: Regular rate and rhythm, no murmurs / rubs  / gallops. No extremity edema. 2+ pedal pulses. No carotid bruits.  Abdomen: Abdominal tenderness is now mostly located in the right lower quadrant and suprapubic region.  Abdomen is soft.   Hypoactive bowel sounds noted.  No evidence of intra-abdominal masses.   Musculoskeletal: No joint deformity upper and lower extremities. Good ROM, no contractures. Normal muscle tone.    Data Reviewed:  I have personally reviewed and interpreted labs, imaging.  Significant findings are   CBC: Recent Labs  Lab 10/06/22 1330 10/07/22 0526 10/08/22 0930  WBC 11.9* 4.0 6.5  NEUTROABS  --   --  5.1  HGB 12.9 9.7* 8.7*  HCT 38.9 30.4* 26.9*  MCV 88.0 90.2 90.6  PLT 439* 312 867    Basic Metabolic Panel: Recent Labs  Lab 10/06/22 1330 10/07/22 0526 10/08/22 0930 10/09/22 0618  NA 139 142 139 138  K 3.4* 3.7 3.8 4.3  CL 96* 105 105 107  CO2 27 27 24 23   GLUCOSE 131* 143* 136* 124*  BUN 13 14 12 10   CREATININE 1.14* 0.92 1.00 0.86  CALCIUM 9.4 8.7* 8.6* 8.5*  MG  --   --  2.2 2.1    GFR: CrCl cannot be calculated (Unknown ideal weight.). Liver Function Tests: Recent Labs  Lab 10/06/22 1330 10/07/22 0526 10/08/22 0930 10/09/22 0618  AST 18 13* 22 13*  ALT 18 14 13 14   ALKPHOS 50 42 31* 35*  BILITOT 1.2 0.8 0.6 0.5  PROT 8.5* 7.1 6.1* 6.4*  ALBUMIN 3.7 3.1* 2.9* 2.9*       Code Status:  Full code.  Code status decision has been confirmed with: patient    Severity of Illness:  The appropriate patient status for this patient is INPATIENT. Inpatient status is judged to be reasonable and necessary in order to provide the required intensity of service to ensure the patient's safety. The patient's presenting symptoms, physical exam findings, and initial radiographic and laboratory data in the context of their chronic comorbidities is felt to place them at high risk for further clinical deterioration. Furthermore, it is not anticipated that the patient will be medically stable  for discharge from the hospital within 2 midnights of admission.   * I certify that at the point of admission it is my clinical judgment that the patient will require inpatient hospital care spanning beyond 2 midnights from the point of admission due to high intensity of service, high risk for further deterioration and high frequency of surveillance required.*  Time spent:  38 minutes  Author:  Vernelle Emerald MD  10/09/2022 9:39 PM

## 2022-10-09 NOTE — Assessment & Plan Note (Signed)
Intravenous folic acid followed by daily folic acid tablets.

## 2022-10-09 NOTE — Progress Notes (Signed)
[facility] Gastroenterology Progress Note  Sharon [name] 42 y.o. [birth year]   Subjective: Loose nonbloody stool this morning. Abdominal pain. Denies N/V. Nurse tech chaperone.  Objective: Vital signs: Vitals:   10/09/22 0508 10/09/22 1237  BP: (!) 137/91 (!) 141/82  Pulse: (!) 45 (!) 42  Resp: 16 16  Temp: 98.1 F (36.7 C) 98.3 F (36.8 C)  SpO2: 98% 97%    Physical Exam: Gen: lethargic, no acute distress, well-nourished HEENT: anicteric sclera CV: RRR Chest: CTA B Abd: diffuse tenderness with guarding, soft, nondistended, +BS Ext: no edema  Lab Results: Recent Labs    10/08/22 0930 10/09/22 0618  NA 139 138  K 3.8 4.3  CL 105 107  CO2 24 23  GLUCOSE 136* 124*  BUN 12 10  CREATININE 1.00 0.86  CALCIUM 8.6* 8.5*  MG 2.2 2.1   Recent Labs    10/08/22 0930 10/09/22 0618  AST 22 13*  ALT 13 14  ALKPHOS 31* 35*  BILITOT 0.6 0.5  PROT 6.1* 6.4*  ALBUMIN 2.9* 2.9*   Recent Labs    10/07/22 0526 10/08/22 0930  WBC 4.0 6.5  NEUTROABS  --  5.1  HGB 9.7* 8.7*  HCT 30.4* 26.9*  MCV 90.2 90.6  PLT 312 291      Assessment/Plan: Complicated Crohn's disease with long stricture in distal ileum and question of small abscess on IV steroids and IV Abx. Failure of several biologics in the past. I had a long talk with her about the role of surgery in this situation and the likelihood that she will need surgery since the stricture is likely more fibro-stenotic than inflammatory. Previous plan as outpt to start Orson Ape but this question of an abscess complicates trial of a new biologic in my opinion. Her hesitancy for surgery in the past is mainly due to her concern of having to have an ostomy placed. She seems to understand the risks of worsening of this stricture and the increased risk of complete SBO and perforation requiring emergency surgery and ostomy placement if the stricture worsens. She seems willing to revisit a discussion with GI surgery about resection vs  stricturoplasty (she asked about this as an option) and I feel she needs to f/u with [facility] and keep following up with [facility]. I do not think stricturoplasty will be an option due to the length of the stricture but defer that to surgery. Continue IV steroids and if symptoms abate in next 2-3 days then consider changing to Prednisone. May need an updated CT prior to d/c to evaluate evolution of abscess and stricture. Continue clear liquid diet. Supportive care. Will follow.   Sharon [name] 10/09/2022, 4:20 PM  Questions please call [phone] ID: Sharon [name], female   DOB: 09-05-1981, 42 y.o.   MRN: [id number]

## 2022-10-10 DIAGNOSIS — D649 Anemia, unspecified: Secondary | ICD-10-CM | POA: Diagnosis present

## 2022-10-10 DIAGNOSIS — E860 Dehydration: Secondary | ICD-10-CM | POA: Diagnosis not present

## 2022-10-10 DIAGNOSIS — R112 Nausea with vomiting, unspecified: Secondary | ICD-10-CM | POA: Diagnosis not present

## 2022-10-10 DIAGNOSIS — K50014 Crohn's disease of small intestine with abscess: Secondary | ICD-10-CM | POA: Diagnosis not present

## 2022-10-10 DIAGNOSIS — E876 Hypokalemia: Secondary | ICD-10-CM | POA: Diagnosis not present

## 2022-10-10 LAB — CBC WITH DIFFERENTIAL/PLATELET
Abs Immature Granulocytes: 0.41 10*3/uL — ABNORMAL HIGH (ref 0.00–0.07)
Basophils Absolute: 0 10*3/uL (ref 0.0–0.1)
Basophils Relative: 0 %
Eosinophils Absolute: 0 10*3/uL (ref 0.0–0.5)
Eosinophils Relative: 0 %
HCT: 27.1 % — ABNORMAL LOW (ref 36.0–46.0)
Hemoglobin: 8.9 g/dL — ABNORMAL LOW (ref 12.0–15.0)
Immature Granulocytes: 6 %
Lymphocytes Relative: 12 %
Lymphs Abs: 0.9 10*3/uL (ref 0.7–4.0)
MCH: 30.1 pg (ref 26.0–34.0)
MCHC: 32.8 g/dL (ref 30.0–36.0)
MCV: 91.6 fL (ref 80.0–100.0)
Monocytes Absolute: 0.4 10*3/uL (ref 0.1–1.0)
Monocytes Relative: 5 %
Neutro Abs: 5.8 10*3/uL (ref 1.7–7.7)
Neutrophils Relative %: 77 %
Platelets: 228 10*3/uL (ref 150–400)
RBC: 2.96 MIL/uL — ABNORMAL LOW (ref 3.87–5.11)
RDW: 13.1 % (ref 11.5–15.5)
WBC: 7.5 10*3/uL (ref 4.0–10.5)
nRBC: 0.7 % — ABNORMAL HIGH (ref 0.0–0.2)

## 2022-10-10 LAB — COMPREHENSIVE METABOLIC PANEL
ALT: 16 U/L (ref 0–44)
AST: 16 U/L (ref 15–41)
Albumin: 2.8 g/dL — ABNORMAL LOW (ref 3.5–5.0)
Alkaline Phosphatase: 33 U/L — ABNORMAL LOW (ref 38–126)
Anion gap: 9 (ref 5–15)
BUN: 11 mg/dL (ref 6–20)
CO2: 25 mmol/L (ref 22–32)
Calcium: 8.6 mg/dL — ABNORMAL LOW (ref 8.9–10.3)
Chloride: 106 mmol/L (ref 98–111)
Creatinine, Ser: 1.04 mg/dL — ABNORMAL HIGH (ref 0.44–1.00)
GFR, Estimated: >60 mL/min (ref >60)
Glucose, Bld: 120 mg/dL — ABNORMAL HIGH (ref 70–99)
Potassium: 4.1 mmol/L (ref 3.5–5.1)
Sodium: 140 mmol/L (ref 135–145)
Total Bilirubin: 0.5 mg/dL (ref 0.3–1.2)
Total Protein: 6.6 g/dL (ref 6.5–8.1)

## 2022-10-10 LAB — MAGNESIUM: Magnesium: 2.1 mg/dL (ref 1.7–2.4)

## 2022-10-10 MED ORDER — PREDNISONE 20 MG PO TABS: 40.0000 mg | ORAL_TABLET | Freq: Every day | ORAL | Status: DC

## 2022-10-10 MED ORDER — AMOXICILLIN-POT CLAVULANATE 875-125 MG PO TABS
1.0000 | ORAL_TABLET | Freq: Two times a day (BID) | ORAL | Status: DC
  Administered 2022-10-10: 1 via ORAL
  Filled 2022-10-10: qty 1

## 2022-10-10 NOTE — Progress Notes (Signed)
Initial Nutrition Assessment RD working remotely.   DOCUMENTATION CODES:   Not applicable  INTERVENTION:  - diet advancement as medically feasible. - complete NFPE when feasible.   NUTRITION DIAGNOSIS:   Inadequate oral intake related to acute illness, nausea, vomiting as evidenced by per patient/family report.  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 Her last EGD/colonoscopy 07/2001 revealed mild gastritis and stenosed ileocolonic anastomosis in the ascending colon with normal terminal ileum. She presented to the ED due to abdominal pain and N/V. CT abdomen/pelvis showed 8 cm segment of markedly severe wall thickening of the distal ileum associated with ulceration thought to be sequela of her severe active Crohn's disease with a fluid collection along the margin of that loop of bowel measuring 4.9 x 1.5 x 1.7 cm suspicious for small abscess formation. GI consulted.  RD able to talk with patient on room phone this morning. No height or weight recorded (RN aware and plans to enter this information). Patient shares that she is 5\' 6"  and estimates her weight to be 190 lb.   She shares that she has been experiencing N/V and abdominal pain with nearly all PO intakes for the past 1 month. She avoids many foods even when not in a flare such as milk, cheese, cruciferous vegetables, and raw foods. She shares that many foods also give her diarrhea; unable to obtain a list of these foods today.   Patient shares that she does not feel comfortable advancing  beyond CLD at this time. She declines offer for order of Boost Breeze as she is unable to tolerate whey protein.   At the end of the call, patient denies any nutrition-related questions or further needs at this time.    Labs reviewed; creatinine: 1.04 mg/dl, Ca: 8.6 mg/dl. Medications reviewed; 1 mg folvite/day, 40 mg solu-medrol BID. IVF; LR-20 mEq KCl @ 75 ml/hr.    NUTRITION - FOCUSED PHYSICAL EXAM:  RD working remotely.  Diet Order:   Diet Order             Diet clear liquid Room service appropriate? Yes; Fluid consistency: Thin  Diet effective now                   EDUCATION NEEDS:   No education needs have been identified at this time  Skin:  Skin Assessment: Reviewed RN Assessment  Last BM:  PTA/unknown  Height:   Ht Readings from Last 1 Encounters:  No data found for Ht    Weight:   Wt Readings from Last 1 Encounters:  No data found for Wt     BMI:  There is no height or weight on file to calculate BMI.  Estimated Nutritional Needs:  Kcal:  2150-2350 kcal Protein:  100-115 grams Fluid:  >/= 2.3 L/day     Jarome Matin, MS, RD, LDN, CNSC Clinical Dietitian PRN/Relief staff On-call/weekend pager # available in [facility]

## 2022-10-10 NOTE — Progress Notes (Addendum)
[facility] Gastroenterology Progress Note  Sharon [name] 42 y.o. 1981/08/08   Subjective: No BMs in 2 days. Abdominal pain manageable. Tolerating clear liquids. Nurse in room.  Objective: Vital signs: Vitals:   10/10/22 0436 10/10/22 1444  BP: 132/73 138/86  Pulse: (!) 41 (!) 58  Resp: 16 14  Temp: 98.2 F (36.8 C) 98.6 F (37 C)  SpO2: 96% 97%    Physical Exam: Gen: lethargic, well-nourished, no acute distress  HEENT: anicteric sclera CV: RRR Chest: CTA B Abd: LLQ and suprapubic tenderness with minimal guarding, soft, nondistended, +BS Ext: no edema  Lab Results: Recent Labs    10/09/22 0618 10/10/22 0522  NA 138 140  K 4.3 4.1  CL 107 106  CO2 23 25  GLUCOSE 124* 120*  BUN 10 11  CREATININE 0.86 1.04*  CALCIUM 8.5* 8.6*  MG 2.1 2.1   Recent Labs    10/09/22 0618 10/10/22 0522  AST 13* 16  ALT 14 16  ALKPHOS 35* 33*  BILITOT 0.5 0.5  PROT 6.4* 6.6  ALBUMIN 2.9* 2.8*   Recent Labs    10/08/22 0930 10/10/22 0522  WBC 6.5 7.5  NEUTROABS 5.1 5.8  HGB 8.7* 8.9*  HCT 26.9* 27.1*  MCV 90.6 91.6  PLT 291 228      Assessment/Plan: Complicated Crohn's with distal ileal stricture and question of an abscess - clinically improving on IV Abx and steroids. Will change to Prednisone 40 mg/day after her IV dose of Solumedrol tonight. Change to oral Abx to complete a 14 day course. Advance diet. If vomiting develops then repeat CT tomorrow. Repeat CT in 2-3 weeks as outpt. Encouraged close f/u with [facility] and surgery. If stable then d/c in next 1-2 days. Will f/u.   Sharon [name] 10/10/2022, 4:18 PM  Questions please call [phone] ID: Sharon [name], female   DOB: 1981/08/19, 42 y.o.   MRN: [id number]

## 2022-10-10 NOTE — Assessment & Plan Note (Signed)
Secondary to combination of folic acid deficiency and anemia of chronic disease No clinical evidence of bleeding Supplementing folic acid Monitoring hemoglobin and hematocrit with serial CBCs.

## 2022-10-10 NOTE — Discharge Summary (Signed)
Physician Discharge Summary   Patient: Sharon [name] MRN: [id number] DOB: November 18, 1980  Admit date:     10/06/2022  Discharge date: 10/10/22  Discharge Physician: Vernelle Emerald   PCP: Patient, No Pcp Per   Recommendations at discharge:   Patient has left [place] without being seen by provider prior to leaving Patient has been instructed to follow-up with her gastroenterologist soon as possible If patient clinically worsens patient has been advised to seek medical attention in a emergency department.  Discharge Diagnoses: Active Problems:   Crohn's disease of ileum with abscess (HCC)   Intractable nausea and vomiting   Dehydration   Hypokalemia due to excessive gastrointestinal loss of potassium   Normocytic anemia   Folic acid deficiency  Resolved Problems:   * No resolved hospital problems. 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 Koleen Distance with GI and Dr. Morton Stall with general surgery at [facility]) also complicated by chronic abdominal pain (follows wtih Dr. Burton Apley with [place] pain management).  She is status post ileocecectomy in 2004.  Her last EGD/colonoscopy 07/2001 revealed mild gastritis as well as a stenosed ileocolonic anastomosis in the ascending colon with normal terminal ileum.    Patient presents to [facility] emergency department with complaints of abdominal pain nausea and vomiting.  CT imaging of the abdomen pelvis was performed in the emergency department revealing 8 cm segment of markedly severe wall thickening of the distal ileum associated with ulceration thought to be sequela of her severe active Crohn's disease with a fluid collection along the margin of that loop of bowel measuring 4.9 x 1.5 x 1.7 cm suspicious for small abscess formation.  EDP discussed case with Dr. Watt Climes  with [facility] gastroenterology who recommended hospitalization with GI consultation in the morning.  The hospitalist group was then called to assess the patient for admission to the hospital.  Patient was admitted to the hospitalist service and under the guidance of gastroenterology was placed on a combination of systemic steroids and intravenous antibiotic therapy.  Patient was initially kept n.p.o. but in the days that followed gradually had her diet advanced to a clear and then a full liquid diet.  Despite these measures, clinical improvement was slow with patient continuing to have significant abdominal pain.  Unfortunately, patient decided to leave [place] the evening of 10/10/2022.  Patient was advised of the risks of morbidity and mortality with patient voicing her understanding of these increased risks at time of discharge and signing all appropriate paperwork.  Patient was advised to seek attention with her primary care provider and gastroenterologist to soon as possible and to seek medical attention in local emergency department if she clinically worsened.    Consultants: Dr. Therisa Doyne with [facility] Procedures performed: None  Disposition:  AMA Diet recommendation:  Full liquid diet, advance as tolerated.  DISCHARGE MEDICATION: Allergies as of 10/10/2022   No Known Allergies      Medication List     TAKE these medications    JUNEL FE 24 PO Take 1 tablet by mouth daily.   omeprazole 20 MG capsule Commonly known as: PRILOSEC Take 20 mg by mouth 2 (two) times daily before a meal.   sertraline 100 MG tablet Commonly known as: ZOLOFT Take 100 mg by mouth 2 (two) times daily.   Stelara 90 MG/ML Sosy injection Generic drug: ustekinumab Inject 90 mg  into the skin every 8 (eight) weeks.         Discharge Exam: Filed Weights   10/10/22 1018  Weight: 86.4 kg   Patient left the facility without being examined by provider.    Condition at  discharge:  AMA, stable  The results of significant diagnostics from this hospitalization (including imaging, microbiology, ancillary and laboratory) are listed below for reference.   Imaging Studies: DG Abd 1 View  Result Date: 10/09/2022 CLINICAL DATA:  Crohn disease. EXAM: ABDOMEN - 1 VIEW COMPARISON:  10/06/2022 FINDINGS: Several mildly dilated small bowel loops are seen in the left abdomen. Gas is also seen within nondilated colon. These findings may be due to ileus or partial small bowel obstruction. IMPRESSION: Mildly dilated small bowel loops in left abdomen, which may be due to ileus or partial small bowel obstruction. Electronically Signed   By: Marlaine Hind M.D.   On: 10/09/2022 09:53   CT ABDOMEN PELVIS W CONTRAST  Result Date: 10/06/2022 CLINICAL DATA:  Acute abdominal pain with reduced appetite, nausea and vomiting, and chest pain. History of Crohn's disease. EXAM: CT ABDOMEN AND PELVIS WITH CONTRAST TECHNIQUE: Multidetector CT imaging of the abdomen and pelvis was performed using the standard protocol following bolus administration of intravenous contrast. RADIATION DOSE REDUCTION: This exam was performed according to the departmental dose-optimization program which includes automated exposure control, adjustment of the mA and/or kV according to patient size and/or use of iterative reconstruction technique. CONTRAST:  141mL OMNIPAQUE IOHEXOL 300 MG/ML  SOLN COMPARISON:  Radiographs of 10/06/2022 FINDINGS: Lower chest: Unremarkable Hepatobiliary: Dependent gallstones in the gallbladder, image 34 series 2. No biliary dilatation. No focal hepatic parenchymal lesion identified. Pancreas: Unremarkable Spleen: Unremarkable Adrenals/Urinary Tract: No significant findings. Stomach/Bowel: There is an 8 cm segment of markedly severe wall thickening in the distal ileum, likely with associated ulceration, surrounding creeping fat, and abnormal stranding in the surrounding mesentery compatible with  severe inflammation and likely reflecting severe active Crohn's disease. Fluid collection along the margin of this loop of bowel measures 4.9 by 1.5 by 1.7 cm (volume = 6.5 cm^3) and is suspicious for a small abscess, although without internal gas density. This abnormal inflammation extends nearly to the ileocecal valve. Proximal to this markedly severely inflamed loop, there are other inflamed and dilated loops of bowel measuring up to 5.4 cm in diameter. Moreover, just proximal to the most severely inflamed distal segment, there is a 2.0 cm intraluminal rim calcified structure on image 71 of series 2 favoring enterolith. No pneumobilia to suggest gallstone ileus. The appendix is not well seen.  No: Lesion identified. Vascular/Lymphatic: Unremarkable Reproductive: Unremarkable Other: Small amount of free pelvic fluid. Musculoskeletal: Small umbilical hernia contains adipose tissue. IMPRESSION: 1. 8 cm segment of markedly severe wall thickening in the distal ileum, likely with associated ulceration, surrounding creeping fat, and abnormal stranding in the surrounding mesentery compatible with severe active Crohn's disease. Severe luminal narrowing in this segment. Fluid collection along the margin of this loop of bowel measures 4.9 by 1.5 by 1.7 cm and is suspicious for a small abscess, although without internal gas density. 2. Proximal to this markedly severely inflamed loop of bowel, there are other inflamed and dilated loops of bowel measuring up to 5.4 cm in diameter. Moreover, there is a 2.0 cm intraluminal rim calcified structure just proximal to the most severely inflamed loop favoring enterolith. 3. Small amount of free pelvic fluid. 4. Cholelithiasis. 5. Small umbilical hernia contains adipose tissue. Electronically Signed  By: Gaylyn Rong M.D.   On: 10/06/2022 17:26   DG Abdomen 1 View  Result Date: 10/06/2022 CLINICAL DATA:  Nausea and vomiting.  History of Crohn's disease. EXAM: ABDOMEN - 1  VIEW COMPARISON:  None Available. FINDINGS: Bowel gas pattern is normal without evidence of ileus or obstruction. Anastomotic sutures noted in the right lower quadrant. No abnormal calcifications. Minimal spinal curvature. IMPRESSION: Negative. Electronically Signed   By: Paulina Fusi M.D.   On: 10/06/2022 13:53   DG Chest 2 View  Result Date: 10/06/2022 CLINICAL DATA:  Chest pain, shortness of breath, nausea, and vomiting EXAM: CHEST - 2 VIEW COMPARISON:  None Available. FINDINGS: Normal lung volumes. No focal consolidations. No pleural effusion or pneumothorax. The heart size and mediastinal contours are within normal limits. The visualized skeletal structures are unremarkable. IMPRESSION: No active cardiopulmonary disease. Electronically Signed   By: Agustin Cree M.D.   On: 10/06/2022 13:52    Microbiology: Results for orders placed or performed during the hospital encounter of 10/06/22  Resp panel by RT-PCR (RSV, Flu A&B, Covid) Anterior Nasal Swab     Status: None   Collection Time: 10/06/22  2:10 PM   Specimen: Anterior Nasal Swab  Result Value Ref Range Status   SARS Coronavirus 2 by RT PCR NEGATIVE NEGATIVE Final    Comment: (NOTE) SARS-CoV-2 target nucleic acids are NOT DETECTED.  The SARS-CoV-2 RNA is generally detectable in upper respiratory specimens during the acute phase of infection. The lowest concentration of SARS-CoV-2 viral copies this assay can detect is 138 copies/mL. A negative result does not preclude SARS-Cov-2 infection and should not be used as the sole basis for treatment or other patient management decisions. A negative result may occur with  improper specimen collection/handling, submission of specimen other than nasopharyngeal swab, presence of viral mutation(s) within the areas targeted by this assay, and inadequate number of viral copies(<138 copies/mL). A negative result must be combined with clinical observations, patient history, and  epidemiological information. The expected result is Negative.  Fact Sheet for Patients:  BloggerCourse.com  Fact Sheet for Healthcare Providers:  SeriousBroker.it  This test is no t yet approved or cleared by the [place] FDA and  has been authorized for detection and/or diagnosis of SARS-CoV-2 by FDA under an Emergency Use Authorization (EUA). This EUA will remain  in effect (meaning this test can be used) for the duration of the COVID-19 declaration under Section 564(b)(1) of the Act, 21 U.S.C.section 360bbb-3(b)(1), unless the authorization is terminated  or revoked sooner.       Influenza A by PCR NEGATIVE NEGATIVE Final   Influenza B by PCR NEGATIVE NEGATIVE Final    Comment: (NOTE) The Xpert Xpress SARS-CoV-2/FLU/RSV plus assay is intended as an aid in the diagnosis of influenza from Nasopharyngeal swab specimens and should not be used as a sole basis for treatment. Nasal washings and aspirates are unacceptable for Xpert Xpress SARS-CoV-2/FLU/RSV testing.  Fact Sheet for Patients: BloggerCourse.com  Fact Sheet for Healthcare Providers: SeriousBroker.it  This test is not yet approved or cleared by the [place] FDA and has been authorized for detection and/or diagnosis of SARS-CoV-2 by FDA under an Emergency Use Authorization (EUA). This EUA will remain in effect (meaning this test can be used) for the duration of the COVID-19 declaration under Section 564(b)(1) of the Act, 21 U.S.C. section 360bbb-3(b)(1), unless the authorization is terminated or revoked.     Resp Syncytial Virus by PCR NEGATIVE NEGATIVE Final    Comment: (  NOTE) Fact Sheet for Patients: BloggerCourse.com  Fact Sheet for Healthcare Providers: SeriousBroker.it  This test is not yet approved or cleared by the [place] FDA and has been  authorized for detection and/or diagnosis of SARS-CoV-2 by FDA under an Emergency Use Authorization (EUA). This EUA will remain in effect (meaning this test can be used) for the duration of the COVID-19 declaration under Section 564(b)(1) of the Act, 21 U.S.C. section 360bbb-3(b)(1), unless the authorization is terminated or revoked.  Performed at [facility], [address] [place], [place] [address]   Culture, blood (Routine X 2) w Reflex to ID Panel     Status: None (Preliminary result)   Collection Time: 10/07/22 10:41 AM   Specimen: BLOOD  Result Value Ref Range Status   Specimen Description   Final    BLOOD BLOOD RIGHT ARM Performed at [facility], [address], [place], [place] [address]    Special Requests   Final    BOTTLES DRAWN AEROBIC ONLY Blood Culture adequate volume Performed at [facility], [address] [place], [place] [address]    Culture   Final    NO GROWTH 3 DAYS Performed at [facility], [address], [place], [place] [address]    Report Status PENDING  Incomplete  Culture, blood (Routine X 2) w Reflex to ID Panel     Status: None (Preliminary result)   Collection Time: 10/07/22 10:41 AM   Specimen: BLOOD  Result Value Ref Range Status   Specimen Description   Final    BLOOD BLOOD RIGHT FOREARM Performed at [facility], [address] [place], [place] [address]    Special Requests   Final    BOTTLES DRAWN AEROBIC ONLY Blood Culture adequate volume Performed at [facility], [address] [place], [place] [address]    Culture   Final    NO GROWTH 3 DAYS Performed at [facility], [address], [place], [place] [address]    Report Status PENDING  Incomplete    Labs: CBC: Recent Labs  Lab 10/06/22 1330 10/07/22 0526 10/08/22 0930 10/10/22 0522  WBC 11.9* 4.0 6.5 7.5  NEUTROABS  --   --  5.1 5.8  HGB 12.9 9.7* 8.7* 8.9*   HCT 38.9 30.4* 26.9* 27.1*  MCV 88.0 90.2 90.6 91.6  PLT 439* 312 291 228   Basic Metabolic Panel: Recent Labs  Lab 10/06/22 1330 10/07/22 0526 10/08/22 0930 10/09/22 0618 10/10/22 0522  NA 139 142 139 138 140  K 3.4* 3.7 3.8 4.3 4.1  CL 96* 105 105 107 106  CO2 27 27 24 23 25   GLUCOSE 131* 143* 136* 124* 120*  BUN 13 14 12 10 11   CREATININE 1.14* 0.92 1.00 0.86 1.04*  CALCIUM 9.4 8.7* 8.6* 8.5* 8.6*  MG  --   --  2.2 2.1 2.1   Liver Function Tests: Recent Labs  Lab 10/06/22 1330 10/07/22 0526 10/08/22 0930 10/09/22 0618 10/10/22 0522  AST 18 13* 22 13* 16  ALT 18 14 13 14 16   ALKPHOS 50 42 31* 35* 33*  BILITOT 1.2 0.8 0.6 0.5 0.5  PROT 8.5* 7.1 6.1* 6.4* 6.6  ALBUMIN 3.7 3.1* 2.9* 2.9* 2.8*   CBG: No results for input(s): "GLUCAP" in the last 168 hours.  Discharge time spent: greater than 30 minutes.  Signed: 10/11/22, MD [facility] 10/10/2022

## 2022-10-10 NOTE — Progress Notes (Signed)
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 Koleen Distance with GI and Dr. Morton Stall with general surgery at [facility]) also complicated by chronic abdominal pain (follows wtih Dr. Burton Apley with [place] pain management).  She is status post ileocecectomy in 2004.  Her last EGD/colonoscopy 07/2001 revealed mild gastritis as well as a stenosed ileocolonic anastomosis in the ascending colon with normal terminal ileum.    Patient presents to [facility] emergency department with complaints of abdominal pain nausea and vomiting.  CT imaging of the abdomen pelvis was performed in the emergency department revealing 8 cm segment of markedly severe wall thickening of the distal ileum associated with ulceration thought to be sequela of her severe active Crohn's disease with a fluid collection along the margin of that loop of bowel measuring 4.9 x 1.5 x 1.7 cm suspicious for small abscess formation.  EDP discussed case with Dr. Watt Climes with [facility] gastroenterology who recommended hospitalization with GI consultation in the morning.  The hospitalist group was then called to assess the patient for admission to the hospital.   Assessment and Plan: Crohn's disease of ileum with abscess ([place]) Complicated presentation of stricturing Crohn's disease with numerous surgical interventions Patient reports last dose of Stelara was 7 weeks ago Patient reports that she is awaiting approval to start Skyrizi On this presentation patient is exhibiting markedly severe wall thickening of the distal ileum with concurrent abscess Transitioning patient from Solu-Medrol to oral prednisone in the morning per GI  recommendations Transitioning from intravenous Flagyl and cefepime to oral Augmentin for concurrent abscess to complete a 14-day course of antibiotics Transitioning to full liquid diet CRP downtrending  As needed opiate-based analgesics for substantial associated pain Gastroenterology following along, their input is appreciated.  Intractable nausea and vomiting Patient initially presented with intractable nausea and vomiting secondary to complicated Crohn's flare Symptoms of nausea vomiting have resolved. Transitioning off of intravenous fluids today. As needed antiemetics Treatment of underlying Crohn's flare  Dehydration Resolved   Hypokalemia due to excessive gastrointestinal loss of potassium Replaced   Normocytic anemia Secondary to combination of folic acid deficiency and anemia of chronic disease No clinical evidence of bleeding Supplementing folic acid Monitoring hemoglobin and hematocrit with serial CBCs.  Folic acid deficiency Intravenous folic acid followed by daily folic acid tablets.    Subjective:  Patient reports her pain is essentially the same as yesterday.  Patient's pain is sharp in quality, moderate in intensity, located in the lower abdomen and nonradiating.  Patient reports she is still tolerating her clear liquid diet.  Physical Exam:  Vitals:   10/09/22 2120 10/10/22 0436 10/10/22 1018 10/10/22 1444  BP: (!) 141/81 132/73  138/86  Pulse: (!) 41 (!) 41  (!) 58  Resp: 16 16  14   Temp: 97.7 F (36.5 C) 98.2 F (36.8 C)  98.6 F (37 C)  TempSrc: Oral Oral  Oral  SpO2: 98% 96%  97%  Weight:   86.4 kg   Height:   5\' 6"  (1.676 m)      Constitutional: Awake alert and oriented x3, no associated distress. Skin: no rashes, no lesions, good skin turgor noted. Eyes: Pupils are equally reactive to light.  No evidence of scleral icterus  or conjunctival pallor.  ENMT: Moist mucous membranes noted.  Posterior pharynx clear of any exudate or lesions.    Respiratory: clear to auscultation bilaterally, no wheezing, no crackles. Normal respiratory effort. No accessory muscle use.  Cardiovascular: Regular rate and rhythm, no murmurs / rubs / gallops. No extremity edema. 2+ pedal pulses. No carotid bruits.  Abdomen: Abdominal tenderness continues to be mostly located in the right lower quadrant and suprapubic region.  Abdomen is soft.   Hypoactive bowel sounds noted.  No evidence of intra-abdominal masses.   Musculoskeletal: No joint deformity upper and lower extremities. Good ROM, no contractures. Normal muscle tone.    Data Reviewed:  I have personally reviewed and interpreted labs, imaging.  Significant findings are   CBC: Recent Labs  Lab 10/06/22 1330 10/07/22 0526 10/08/22 0930 10/10/22 0522  WBC 11.9* 4.0 6.5 7.5  NEUTROABS  --   --  5.1 5.8  HGB 12.9 9.7* 8.7* 8.9*  HCT 38.9 30.4* 26.9* 27.1*  MCV 88.0 90.2 90.6 91.6  PLT 439* 312 291 330   Basic Metabolic Panel: Recent Labs  Lab 10/06/22 1330 10/07/22 0526 10/08/22 0930 10/09/22 0618 10/10/22 0522  NA 139 142 139 138 140  K 3.4* 3.7 3.8 4.3 4.1  CL 96* 105 105 107 106  CO2 27 27 24 23 25   GLUCOSE 131* 143* 136* 124* 120*  BUN 13 14 12 10 11   CREATININE 1.14* 0.92 1.00 0.86 1.04*  CALCIUM 9.4 8.7* 8.6* 8.5* 8.6*  MG  --   --  2.2 2.1 2.1   GFR: Estimated Creatinine Clearance: 79.6 mL/min (A) (by C-G formula based on SCr of 1.04 mg/dL (H)). Liver Function Tests: Recent Labs  Lab 10/06/22 1330 10/07/22 0526 10/08/22 0930 10/09/22 0618 10/10/22 0522  AST 18 13* 22 13* 16  ALT 18 14 13 14 16   ALKPHOS 50 42 31* 35* 33*  BILITOT 1.2 0.8 0.6 0.5 0.5  PROT 8.5* 7.1 6.1* 6.4* 6.6  ALBUMIN 3.7 3.1* 2.9* 2.9* 2.8*      Code Status:  Full code.  Code status decision has been confirmed with: patient   Severity of Illness:  The appropriate patient status for this patient is INPATIENT. Inpatient status is judged to be reasonable and necessary in order to  provide the required intensity of service to ensure the patient's safety. The patient's presenting symptoms, physical exam findings, and initial radiographic and laboratory data in the context of their chronic comorbidities is felt to place them at high risk for further clinical deterioration. Furthermore, it is not anticipated that the patient will be medically stable for discharge from the hospital within 2 midnights of admission.   * I certify that at the point of admission it is my clinical judgment that the patient will require inpatient hospital care spanning beyond 2 midnights from the point of admission due to high intensity of service, high risk for further deterioration and high frequency of surveillance required.*  Time spent:  35 minutes  Author:  Vernelle Emerald MD  10/10/2022 9:37 PM

## 2022-10-11 ENCOUNTER — Encounter: Payer: Self-pay | Admitting: Internal Medicine

## 2022-10-12 LAB — CULTURE, BLOOD (ROUTINE X 2)
Culture: NO GROWTH
Culture: NO GROWTH
Special Requests: ADEQUATE
Special Requests: ADEQUATE

## 2022-10-13 ENCOUNTER — Inpatient Hospital Stay (HOSPITAL_COMMUNITY)
Admission: EM | Admit: 2022-10-13 | Discharge: 2022-10-19 | DRG: 387 | Disposition: A | Discharge status: Home Health | Payer: Medicare Other | Attending: Internal Medicine | Admitting: Internal Medicine

## 2022-10-13 ENCOUNTER — Other Ambulatory Visit: Payer: Self-pay

## 2022-10-13 ENCOUNTER — Encounter (HOSPITAL_COMMUNITY): Payer: Self-pay | Admitting: Emergency Medicine

## 2022-10-13 ENCOUNTER — Emergency Department (HOSPITAL_COMMUNITY): Payer: Medicare Other

## 2022-10-13 DIAGNOSIS — D649 Anemia, unspecified: Secondary | ICD-10-CM | POA: Diagnosis not present

## 2022-10-13 DIAGNOSIS — K5010 Crohn's disease of large intestine without complications: Secondary | ICD-10-CM | POA: Diagnosis present

## 2022-10-13 DIAGNOSIS — E669 Obesity, unspecified: Secondary | ICD-10-CM | POA: Diagnosis present

## 2022-10-13 DIAGNOSIS — Z79899 Other long term (current) drug therapy: Secondary | ICD-10-CM

## 2022-10-13 DIAGNOSIS — E860 Dehydration: Secondary | ICD-10-CM | POA: Diagnosis present

## 2022-10-13 DIAGNOSIS — R112 Nausea with vomiting, unspecified: Secondary | ICD-10-CM

## 2022-10-13 DIAGNOSIS — R55 Syncope and collapse: Secondary | ICD-10-CM | POA: Insufficient documentation

## 2022-10-13 DIAGNOSIS — K8020 Calculus of gallbladder without cholecystitis without obstruction: Secondary | ICD-10-CM | POA: Diagnosis present

## 2022-10-13 DIAGNOSIS — Z888 Allergy status to other drugs, medicaments and biological substances status: Secondary | ICD-10-CM

## 2022-10-13 DIAGNOSIS — E876 Hypokalemia: Secondary | ICD-10-CM | POA: Diagnosis present

## 2022-10-13 DIAGNOSIS — R748 Abnormal levels of other serum enzymes: Secondary | ICD-10-CM | POA: Diagnosis present

## 2022-10-13 DIAGNOSIS — K50119 Crohn's disease of large intestine with unspecified complications: Secondary | ICD-10-CM | POA: Diagnosis not present

## 2022-10-13 DIAGNOSIS — I951 Orthostatic hypotension: Secondary | ICD-10-CM | POA: Diagnosis present

## 2022-10-13 DIAGNOSIS — F32A Depression, unspecified: Secondary | ICD-10-CM | POA: Diagnosis present

## 2022-10-13 DIAGNOSIS — K5080 Crohn's disease of both small and large intestine without complications: Secondary | ICD-10-CM | POA: Diagnosis not present

## 2022-10-13 DIAGNOSIS — Z6830 Body mass index (BMI) 30.0-30.9, adult: Secondary | ICD-10-CM

## 2022-10-13 LAB — URINALYSIS, ROUTINE W REFLEX MICROSCOPIC
Bacteria, UA: NONE SEEN
Bilirubin Urine: NEGATIVE
Glucose, UA: NEGATIVE mg/dL
Hgb urine dipstick: NEGATIVE
Ketones, ur: 20 mg/dL — AB
Leukocytes,Ua: NEGATIVE
Nitrite: NEGATIVE
Protein, ur: 30 mg/dL — AB
Specific Gravity, Urine: 1.016 (ref 1.005–1.030)
pH: 6 (ref 5.0–8.0)

## 2022-10-13 LAB — COMPREHENSIVE METABOLIC PANEL
ALT: 16 U/L (ref 0–44)
AST: 13 U/L — ABNORMAL LOW (ref 15–41)
Albumin: 3 g/dL — ABNORMAL LOW (ref 3.5–5.0)
Alkaline Phosphatase: 32 U/L — ABNORMAL LOW (ref 38–126)
Anion gap: 12 (ref 5–15)
BUN: <5 mg/dL — ABNORMAL LOW (ref 6–20)
CO2: 25 mmol/L (ref 22–32)
Calcium: 8.5 mg/dL — ABNORMAL LOW (ref 8.9–10.3)
Chloride: 101 mmol/L (ref 98–111)
Creatinine, Ser: 1.14 mg/dL — ABNORMAL HIGH (ref 0.44–1.00)
GFR, Estimated: >60 mL/min (ref >60)
Glucose, Bld: 90 mg/dL (ref 70–99)
Potassium: 3 mmol/L — ABNORMAL LOW (ref 3.5–5.1)
Sodium: 138 mmol/L (ref 135–145)
Total Bilirubin: 0.8 mg/dL (ref 0.3–1.2)
Total Protein: 6.4 g/dL — ABNORMAL LOW (ref 6.5–8.1)

## 2022-10-13 LAB — CBC WITH DIFFERENTIAL/PLATELET
Abs Immature Granulocytes: 0.08 10*3/uL — ABNORMAL HIGH (ref 0.00–0.07)
Basophils Absolute: 0 10*3/uL (ref 0.0–0.1)
Basophils Relative: 0 %
Eosinophils Absolute: 0.1 10*3/uL (ref 0.0–0.5)
Eosinophils Relative: 1 %
HCT: 30.9 % — ABNORMAL LOW (ref 36.0–46.0)
Hemoglobin: 10.5 g/dL — ABNORMAL LOW (ref 12.0–15.0)
Immature Granulocytes: 1 %
Lymphocytes Relative: 12 %
Lymphs Abs: 1.4 10*3/uL (ref 0.7–4.0)
MCH: 30 pg (ref 26.0–34.0)
MCHC: 34 g/dL (ref 30.0–36.0)
MCV: 88.3 fL (ref 80.0–100.0)
Monocytes Absolute: 0.5 10*3/uL (ref 0.1–1.0)
Monocytes Relative: 4 %
Neutro Abs: 9.5 10*3/uL — ABNORMAL HIGH (ref 1.7–7.7)
Neutrophils Relative %: 82 %
Platelets: 292 10*3/uL (ref 150–400)
RBC: 3.5 MIL/uL — ABNORMAL LOW (ref 3.87–5.11)
RDW: 14.1 % (ref 11.5–15.5)
WBC: 11.6 10*3/uL — ABNORMAL HIGH (ref 4.0–10.5)
nRBC: 0 % (ref 0.0–0.2)

## 2022-10-13 LAB — LIPASE, BLOOD: Lipase: 179 U/L — ABNORMAL HIGH (ref 11–51)

## 2022-10-13 LAB — PREGNANCY, URINE: Preg Test, Ur: NEGATIVE

## 2022-10-13 LAB — MAGNESIUM: Magnesium: 1.7 mg/dL (ref 1.7–2.4)

## 2022-10-13 MED ORDER — HYDROMORPHONE HCL 1 MG/ML IJ SOLN
0.5000 mg | INTRAMUSCULAR | Status: DC | PRN
  Administered 2022-10-13 – 2022-10-14 (×4): 1 mg via INTRAVENOUS
  Filled 2022-10-13 (×4): qty 1

## 2022-10-13 MED ORDER — METRONIDAZOLE 500 MG/100ML IV SOLN
500.0000 mg | Freq: Two times a day (BID) | INTRAVENOUS | Status: DC
  Administered 2022-10-14 – 2022-10-19 (×11): 500 mg via INTRAVENOUS
  Filled 2022-10-13 (×11): qty 100

## 2022-10-13 MED ORDER — ACETAMINOPHEN 325 MG PO TABS: 650.0000 mg | ORAL_TABLET | Freq: Four times a day (QID) | ORAL | Status: DC | PRN

## 2022-10-13 MED ORDER — POTASSIUM CHLORIDE CRYS ER 20 MEQ PO TBCR
40.0000 meq | EXTENDED_RELEASE_TABLET | Freq: Once | ORAL | Status: AC
  Administered 2022-10-13: 40 meq via ORAL
  Filled 2022-10-13: qty 2

## 2022-10-13 MED ORDER — IOHEXOL 350 MG/ML SOLN
75.0000 mL | Freq: Once | INTRAVENOUS | Status: AC | PRN
  Administered 2022-10-13: 75 mL via INTRAVENOUS

## 2022-10-13 MED ORDER — OXYCODONE-ACETAMINOPHEN 5-325 MG PO TABS
1.0000 | ORAL_TABLET | Freq: Once | ORAL | Status: AC
  Administered 2022-10-13: 1 via ORAL
  Filled 2022-10-13: qty 1

## 2022-10-13 MED ORDER — PANTOPRAZOLE SODIUM 40 MG PO TBEC
40.0000 mg | DELAYED_RELEASE_TABLET | Freq: Every day | ORAL | Status: DC
  Administered 2022-10-14 – 2022-10-19 (×6): 40 mg via ORAL
  Filled 2022-10-13 (×6): qty 1

## 2022-10-13 MED ORDER — ONDANSETRON 4 MG PO TBDP
8.0000 mg | ORAL_TABLET | Freq: Once | ORAL | Status: AC
  Administered 2022-10-13: 8 mg via ORAL
  Filled 2022-10-13: qty 2

## 2022-10-13 MED ORDER — HYDROCODONE-ACETAMINOPHEN 5-325 MG PO TABS
1.0000 | ORAL_TABLET | Freq: Four times a day (QID) | ORAL | Status: DC | PRN
  Administered 2022-10-15 – 2022-10-19 (×5): 1 via ORAL
  Filled 2022-10-13 (×6): qty 1

## 2022-10-13 MED ORDER — METRONIDAZOLE 500 MG/100ML IV SOLN
500.0000 mg | Freq: Once | INTRAVENOUS | Status: AC
  Administered 2022-10-13: 500 mg via INTRAVENOUS
  Filled 2022-10-13: qty 100

## 2022-10-13 MED ORDER — METHYLPREDNISOLONE SODIUM SUCC 40 MG IJ SOLR
40.0000 mg | Freq: Two times a day (BID) | INTRAMUSCULAR | Status: DC
  Administered 2022-10-14 – 2022-10-16 (×5): 40 mg via INTRAVENOUS
  Filled 2022-10-13 (×5): qty 1

## 2022-10-13 MED ORDER — RAMELTEON 8 MG PO TABS
8.0000 mg | ORAL_TABLET | Freq: Every day | ORAL | Status: DC
  Administered 2022-10-13 – 2022-10-14 (×2): 8 mg via ORAL
  Filled 2022-10-13 (×2): qty 1

## 2022-10-13 MED ORDER — TRAZODONE HCL 50 MG PO TABS: 50.0000 mg | ORAL_TABLET | Freq: Every evening | ORAL | Status: DC | PRN

## 2022-10-13 MED ORDER — SODIUM CHLORIDE 0.9% FLUSH
3.0000 mL | Freq: Two times a day (BID) | INTRAVENOUS | Status: DC
  Administered 2022-10-14 – 2022-10-19 (×9): 3 mL via INTRAVENOUS

## 2022-10-13 MED ORDER — SODIUM CHLORIDE 0.9 % IV SOLN: INTRAVENOUS | Status: DC

## 2022-10-13 MED ORDER — FENTANYL CITRATE PF 50 MCG/ML IJ SOSY
50.0000 ug | PREFILLED_SYRINGE | Freq: Once | INTRAMUSCULAR | Status: AC
  Administered 2022-10-13: 50 ug via INTRAVENOUS
  Filled 2022-10-13: qty 1

## 2022-10-13 MED ORDER — METHYLPREDNISOLONE SODIUM SUCC 40 MG IJ SOLR
40.0000 mg | Freq: Once | INTRAMUSCULAR | Status: AC
  Administered 2022-10-13: 40 mg via INTRAVENOUS
  Filled 2022-10-13: qty 1

## 2022-10-13 MED ORDER — LACTATED RINGERS IV BOLUS
1000.0000 mL | Freq: Once | INTRAVENOUS | Status: AC
  Administered 2022-10-13: 1000 mL via INTRAVENOUS

## 2022-10-13 MED ORDER — ENOXAPARIN SODIUM 40 MG/0.4ML IJ SOSY
40.0000 mg | PREFILLED_SYRINGE | INTRAMUSCULAR | Status: DC
  Filled 2022-10-13 (×4): qty 0.4

## 2022-10-13 MED ORDER — POLYETHYLENE GLYCOL 3350 17 G PO PACK: 17.0000 g | PACK | Freq: Every day | ORAL | Status: DC | PRN

## 2022-10-13 MED ORDER — ACETAMINOPHEN 650 MG RE SUPP: 650.0000 mg | Freq: Four times a day (QID) | RECTAL | Status: DC | PRN

## 2022-10-13 NOTE — Progress Notes (Signed)
Visited with pt.  Provided emotional and spiritual support.  Husband at bedside. Chaplain available as needed.  Jaclynn Major, [place], [facility], Pager [phone]

## 2022-10-13 NOTE — ED Notes (Signed)
Pt requesting something to help her sleep tonight. Trilby Drummer MD paged

## 2022-10-13 NOTE — ED Triage Notes (Signed)
Pt reports n/v/d and abdominal pain X1 month.  Pt indicated she just left the hospital b/c she was "confused."

## 2022-10-13 NOTE — ED Notes (Signed)
Patient resting in bed with husband at bedside. Patient and husband given water per request. Patient denies any other needs at this time.

## 2022-10-13 NOTE — ED Notes (Signed)
ED TO INPATIENT HANDOFF REPORT  ED Nurse Name and Phone #: Brett Canales [phone]  S Name/Age/Gender Sharon [name] 42 y.o. female Room/Bed: 044C/044C  Code Status   Code Status: Full Code  Home/SNF/Other Home Patient oriented to: self, place, time, and situation Is this baseline? Yes   Triage Complete: Triage complete  Chief Complaint Crohn's colitis (HCC) [K50.10]  Triage Note Pt reports n/v/d and abdominal pain X1 month.  Pt indicated she just left the hospital b/c she was "confused."     Allergies No Known Allergies  Level of Care/Admitting Diagnosis ED Disposition     ED Disposition  Admit   Condition  --   Comment  Hospital Area: [facility] [[id number]]  Level of Care: Telemetry Medical [104]  May place patient in observation at [facility] or [facility] if equivalent level of care is available:: No  Covid Evaluation: Asymptomatic - no recent exposure (last 10 days) testing not required  Diagnosis: Crohn's colitis [facility]) [[id number]]  Admitting Physician: Synetta Fail [[id number]]  Attending Physician: Synetta Fail [phone]          B Medical/Surgery History Past Medical History:  Diagnosis Date   Crohn's disease (HCC)    dx 1996   History of abnormal cervical Pap smear    History of genital warts    Infertility    Pelvic peritoneal adhesions, female    Perirectal abscess    Past Surgical History:  Procedure Laterality Date   bladder perforation     COLONOSCOPY  last one 02-25-2011   LAPAROSCOPIC LYSIS INTESTINAL ADHESIONS     RIGHT HEMICOLECTOMY/ ANASTOMOSIS/  REPAIR ABDOMINAL WALL HERNIA/  RIGHT SALPINOOPHORECTOMY  01/ 2005   CROHN'S     A IV Location/Drains/Wounds Patient Lines/Drains/Airways Status     Active Line/Drains/Airways     Name Placement date Placement time Site Days   Peripheral IV 10/13/22 22 G Right Antecubital 10/13/22  0920  Antecubital  less than 1            Intake/Output Last 24 hours No  intake or output data in the 24 hours ending 10/13/22 1751  Labs/Imaging Results for orders placed or performed during the hospital encounter of 10/13/22 (from the past 48 hour(s))  Comprehensive metabolic panel     Status: Abnormal   Collection Time: 10/13/22  5:01 AM  Result Value Ref Range   Sodium 138 135 - 145 mmol/L   Potassium 3.0 (L) 3.5 - 5.1 mmol/L   Chloride 101 98 - 111 mmol/L   CO2 25 22 - 32 mmol/L   Glucose, Bld 90 70 - 99 mg/dL    Comment: Glucose reference range applies only to samples taken after fasting for at least 8 hours.   BUN <5 (L) 6 - 20 mg/dL   Creatinine, Ser 1.02 (H) 0.44 - 1.00 mg/dL   Calcium 8.5 (L) 8.9 - 10.3 mg/dL   Total Protein 6.4 (L) 6.5 - 8.1 g/dL   Albumin 3.0 (L) 3.5 - 5.0 g/dL   AST 13 (L) 15 - 41 U/L   ALT 16 0 - 44 U/L   Alkaline Phosphatase 32 (L) 38 - 126 U/L   Total Bilirubin 0.8 0.3 - 1.2 mg/dL   GFR, Estimated >58 >52 mL/min    Comment: (NOTE) Calculated using the CKD-EPI Creatinine Equation (2021)    Anion gap 12 5 - 15    Comment: Performed at [facility], [address], [place], [place] [address]  CBC with  Differential     Status: Abnormal   Collection Time: 10/13/22  5:01 AM  Result Value Ref Range   WBC 11.6 (H) 4.0 - 10.5 K/uL   RBC 3.50 (L) 3.87 - 5.11 MIL/uL   Hemoglobin 10.5 (L) 12.0 - 15.0 g/dL   HCT 48.5 (L) 46.2 - 70.3 %   MCV 88.3 80.0 - 100.0 fL   MCH 30.0 26.0 - 34.0 pg   MCHC 34.0 30.0 - 36.0 g/dL   RDW 50.0 93.8 - 18.2 %   Platelets 292 150 - 400 K/uL   nRBC 0.0 0.0 - 0.2 %   Neutrophils Relative % 82 %   Neutro Abs 9.5 (H) 1.7 - 7.7 K/uL   Lymphocytes Relative 12 %   Lymphs Abs 1.4 0.7 - 4.0 K/uL   Monocytes Relative 4 %   Monocytes Absolute 0.5 0.1 - 1.0 K/uL   Eosinophils Relative 1 %   Eosinophils Absolute 0.1 0.0 - 0.5 K/uL   Basophils Relative 0 %   Basophils Absolute 0.0 0.0 - 0.1 K/uL   Immature Granulocytes 1 %   Abs Immature Granulocytes 0.08 (H) 0.00 - 0.07 K/uL    Comment:  Performed at [facility], [address], [place], [place] [address]  Lipase, blood     Status: Abnormal   Collection Time: 10/13/22  5:01 AM  Result Value Ref Range   Lipase 179 (H) 11 - 51 U/L    Comment: Performed at [facility], [address], [place], [place] [address]  Magnesium     Status: None   Collection Time: 10/13/22  5:21 AM  Result Value Ref Range   Magnesium 1.7 1.7 - 2.4 mg/dL    Comment: Performed at [facility], [address] [place], [place] [address]  Pregnancy, urine     Status: None   Collection Time: 10/13/22  5:50 AM  Result Value Ref Range   Preg Test, Ur NEGATIVE NEGATIVE    Comment:        THE SENSITIVITY OF THIS METHODOLOGY IS >20 mIU/mL. Performed at [facility], [address], [place], [place] [address]   Urinalysis, Routine w reflex microscopic     Status: Abnormal   Collection Time: 10/13/22  5:50 AM  Result Value Ref Range   Color, Urine YELLOW YELLOW   APPearance HAZY (A) CLEAR   Specific Gravity, Urine 1.016 1.005 - 1.030   pH 6.0 5.0 - 8.0   Glucose, UA NEGATIVE NEGATIVE mg/dL   Hgb urine dipstick NEGATIVE NEGATIVE   Bilirubin Urine NEGATIVE NEGATIVE   Ketones, ur 20 (A) NEGATIVE mg/dL   Protein, ur 30 (A) NEGATIVE mg/dL   Nitrite NEGATIVE NEGATIVE   Leukocytes,Ua NEGATIVE NEGATIVE   RBC / HPF 0-5 0 - 5 RBC/hpf   WBC, UA 0-5 0 - 5 WBC/hpf   Bacteria, UA NONE SEEN NONE SEEN   Squamous Epithelial / HPF 6-10 0 - 5 /HPF   Mucus PRESENT    Hyaline Casts, UA PRESENT     Comment: Performed at [facility], [address], [place], [place] [address]   CT ABDOMEN PELVIS W CONTRAST  Result Date: 10/13/2022 CLINICAL DATA:  Abdominal pain. EXAM: CT ABDOMEN AND PELVIS WITH CONTRAST TECHNIQUE: Multidetector CT imaging of the abdomen and pelvis was performed using the standard protocol following bolus administration of intravenous contrast. RADIATION DOSE REDUCTION: This exam was performed according to the  departmental dose-optimization program which includes automated exposure  control, adjustment of the mA and/or kV according to patient size and/or use of iterative reconstruction technique. CONTRAST:  63mL OMNIPAQUE IOHEXOL 350 MG/ML SOLN COMPARISON:  10/06/2022 FINDINGS: Lower chest: Small bilateral pleural effusions are new in the interval. Hepatobiliary: No suspicious focal abnormality within the liver parenchyma. Small area of low attenuation in the anterior liver, adjacent to the falciform ligament, is in a characteristic location for focal fatty deposition. Layering tiny calcified gallstones evident. No intrahepatic or extrahepatic biliary dilation. Pancreas: No focal mass lesion. No dilatation of the main duct. No intraparenchymal cyst. No peripancreatic edema. Spleen: No splenomegaly. No focal mass lesion. Adrenals/Urinary Tract: No adrenal nodule or mass. Kidneys unremarkable. No evidence for hydroureter. The urinary bladder appears normal for the degree of distention. Stomach/Bowel: Stomach is unremarkable. No gastric wall thickening. No evidence of outlet obstruction. Duodenum is normally positioned as is the ligament of Treitz. Jejunal loops are nondilated. There is a segment of ileum in the central pelvis there is dilated up to 4.7 cm diameter with dependent enterolith evident. As on the prior study, there is a 5-6 cm segment of distal ileum just proximal to the neo terminal ileum demonstrating circumferential wall thickening and perienteric edema/inflammation. This is immediately distal to the dilated segment consistent with inflammatory stricture. There is also a second area of circumferential wall thickening just proximal to the dilated segment (see image 64/3). Status post ileocecectomy. No gross colonic mass. No colonic wall thickening. Vascular/Lymphatic: No abdominal aortic aneurysm. No abdominal aortic atherosclerotic calcification. There is no gastrohepatic or hepatoduodenal ligament  lymphadenopathy. No retroperitoneal or mesenteric lymphadenopathy. No pelvic sidewall lymphadenopathy. Reproductive: Unremarkable. Other: No intraperitoneal free fluid. Musculoskeletal: No worrisome lytic or sclerotic osseous abnormality. Small umbilical hernia contains only fat. IMPRESSION: 1. 5-6 cm segment of distal ileum just proximal to the neo terminal ileum demonstrating circumferential wall thickening and perienteric edema/inflammation. This is immediately distal to a dilated segment consistent with inflammatory stricture. There is some fluid along the more distal inflammatory stricture, but is less focal than on the prior study and no rim enhancing or organized mesenteric abscess evident on today's exam. As before, there is also a second area of circumferential wall thickening just proximal to the dilated segment. Generally, imaging features suggest inflammatory bowel disease. 2. As on the prior study, there is a second segment of abnormal small bowel just proximal to the dilated segment. 3. Small bilateral pleural effusions are new in the interval. 4. Cholelithiasis. Electronically Signed   By: Misty Stanley M.D.   On: 10/13/2022 09:33    Pending Labs Unresulted Labs (From admission, onward)     Start     Ordered   10/20/22 0500  Creatinine, serum  (enoxaparin (LOVENOX)    CrCl >/= 30 ml/min)  Weekly,   R     Comments: while on enoxaparin therapy    10/13/22 1520   10/14/22 0500  CBC  Tomorrow morning,   R        10/13/22 1520   10/14/22 0500  Comprehensive metabolic panel  Tomorrow morning,   R        10/13/22 1520            Vitals/Pain Today's Vitals   10/13/22 0452 10/13/22 0504 10/13/22 1012 10/13/22 1434  BP:  100/71 104/71 120/84  Pulse:  (!) 116 83 (!) 52  Resp:  20 18 18   Temp: 98 F (36.7 C)  97.8 F (36.6 C) 98 F (36.7 C)  TempSrc:   Oral  SpO2:  94% 98% 98%  Weight:      Height:      PainSc:        Isolation Precautions No active  isolations  Medications Medications  pantoprazole (PROTONIX) EC tablet 40 mg (has no administration in time range)  enoxaparin (LOVENOX) injection 40 mg (40 mg Subcutaneous Patient Refused/Not Given 10/13/22 1636)  sodium chloride flush (NS) 0.9 % injection 3 mL (3 mLs Intravenous Not Given 10/13/22 1630)  acetaminophen (TYLENOL) tablet 650 mg (has no administration in time range)    Or  acetaminophen (TYLENOL) suppository 650 mg (has no administration in time range)  0.9 %  sodium chloride infusion ( Intravenous New Bag/Given 10/13/22 1628)  polyethylene glycol (MIRALAX / GLYCOLAX) packet 17 g (has no administration in time range)  HYDROcodone-acetaminophen (NORCO/VICODIN) 5-325 MG per tablet 1 tablet (has no administration in time range)  HYDROmorphone (DILAUDID) injection 0.5-1 mg (has no administration in time range)  metroNIDAZOLE (FLAGYL) IVPB 500 mg (has no administration in time range)  methylPREDNISolone sodium succinate (SOLU-MEDROL) 40 mg/mL injection 40 mg (has no administration in time range)  ondansetron (ZOFRAN-ODT) disintegrating tablet 8 mg (8 mg Oral Given 10/13/22 0531)  oxyCODONE-acetaminophen (PERCOCET/ROXICET) 5-325 MG per tablet 1 tablet (1 tablet Oral Given 10/13/22 0532)  iohexol (OMNIPAQUE) 350 MG/ML injection 75 mL (75 mLs Intravenous Contrast Given 10/13/22 0920)  potassium chloride SA (KLOR-CON M) CR tablet 40 mEq (40 mEq Oral Given 10/13/22 1431)  lactated ringers bolus 1,000 mL (0 mLs Intravenous Stopped 10/13/22 1720)  fentaNYL (SUBLIMAZE) injection 50 mcg (50 mcg Intravenous Given 10/13/22 1432)  metroNIDAZOLE (FLAGYL) IVPB 500 mg (0 mg Intravenous Stopped 10/13/22 1750)  methylPREDNISolone sodium succinate (SOLU-MEDROL) 40 mg/mL injection 40 mg (40 mg Intravenous Given 10/13/22 1628)    Mobility walks     Focused Assessments Gastrointestinal   R Recommendations: See Admitting Provider Note  Report given to:   Additional Notes:

## 2022-10-13 NOTE — ED Notes (Signed)
Patient transport taking patient to room.

## 2022-10-13 NOTE — ED Notes (Signed)
ED TO INPATIENT HANDOFF REPORT  ED Nurse Name and Phone #:  Kelby Aline [phone]  S Name/Age/Gender Sharon [name] 42 y.o. female Room/Bed: 044C/044C  Code Status   Code Status: Full Code  Home/SNF/Other Home Patient oriented to: self, place, time, and situation Is this baseline? Yes   Triage Complete: Triage complete  Chief Complaint Crohn's colitis ([place]) [K50.10]  Triage Note Pt reports n/v/d and abdominal pain X1 month.  Pt indicated she just left the hospital b/c she was "confused."     Allergies Allergies  Allergen Reactions   Other Hives    Adhesive for EKG leads - hives at location    Level of Care/Admitting Diagnosis ED Disposition     ED Disposition  [place] [place] [[id number]]  Level of Care: Telemetry Medical [104]  May place patient in observation at [facility] or [place] if equivalent level of care is available:: No  Covid Evaluation: Asymptomatic - no recent exposure (last 10 days) testing not required  Diagnosis: Crohn's colitis [facility]) [[id number]]  Admitting Physician: Marcelyn Bruins [[id number]]  Attending Physician: Marcelyn Bruins [phone]          B Medical/Surgery History Past Medical History:  Diagnosis Date   Crohn's disease ([place])    dx 1996   History of abnormal cervical Pap smear    History of genital warts    Infertility    Pelvic peritoneal adhesions, female    Perirectal abscess    Past Surgical History:  Procedure Laterality Date   bladder perforation     COLONOSCOPY  last one 02-25-2011   LAPAROSCOPIC LYSIS INTESTINAL ADHESIONS     RIGHT HEMICOLECTOMY/ ANASTOMOSIS/  REPAIR ABDOMINAL WALL HERNIA/  RIGHT SALPINOOPHORECTOMY  01/ 2005   CROHN'S     A IV Location/Drains/Wounds Patient Lines/Drains/Airways Status     Active Line/Drains/Airways     Name Placement date Placement time Site Days   Peripheral IV 10/13/22 22 G Right Antecubital 10/13/22  0920   Antecubital  less than 1            Intake/Output Last 24 hours No intake or output data in the 24 hours ending 10/13/22 2257  Labs/Imaging Results for orders placed or performed during the hospital encounter of 10/13/22 (from the past 48 hour(s))  Comprehensive metabolic panel     Status: Abnormal   Collection Time: 10/13/22  5:01 AM  Result Value Ref Range   Sodium 138 135 - 145 mmol/L   Potassium 3.0 (L) 3.5 - 5.1 mmol/L   Chloride 101 98 - 111 mmol/L   CO2 25 22 - 32 mmol/L   Glucose, Bld 90 70 - 99 mg/dL    Comment: Glucose reference range applies only to samples taken after fasting for at least 8 hours.   BUN <5 (L) 6 - 20 mg/dL   Creatinine, Ser 1.14 (H) 0.44 - 1.00 mg/dL   Calcium 8.5 (L) 8.9 - 10.3 mg/dL   Total Protein 6.4 (L) 6.5 - 8.1 g/dL   Albumin 3.0 (L) 3.5 - 5.0 g/dL   AST 13 (L) 15 - 41 U/L   ALT 16 0 - 44 U/L   Alkaline Phosphatase 32 (L) 38 - 126 U/L   Total Bilirubin 0.8 0.3 - 1.2 mg/dL   GFR, Estimated >60 >60 mL/min    Comment: (NOTE) Calculated using the CKD-EPI Creatinine Equation (2021)    Anion gap 12 5 - 15  Comment: Performed at [facility], [address], [place], [place] [address]  CBC with Differential     Status: Abnormal   Collection Time: 10/13/22  5:01 AM  Result Value Ref Range   WBC 11.6 (H) 4.0 - 10.5 K/uL   RBC 3.50 (L) 3.87 - 5.11 MIL/uL   Hemoglobin 10.5 (L) 12.0 - 15.0 g/dL   HCT 96.7 (L) 89.3 - 81.0 %   MCV 88.3 80.0 - 100.0 fL   MCH 30.0 26.0 - 34.0 pg   MCHC 34.0 30.0 - 36.0 g/dL   RDW 17.5 10.2 - 58.5 %   Platelets 292 150 - 400 K/uL   nRBC 0.0 0.0 - 0.2 %   Neutrophils Relative % 82 %   Neutro Abs 9.5 (H) 1.7 - 7.7 K/uL   Lymphocytes Relative 12 %   Lymphs Abs 1.4 0.7 - 4.0 K/uL   Monocytes Relative 4 %   Monocytes Absolute 0.5 0.1 - 1.0 K/uL   Eosinophils Relative 1 %   Eosinophils Absolute 0.1 0.0 - 0.5 K/uL   Basophils Relative 0 %   Basophils Absolute 0.0 0.0 - 0.1 K/uL   Immature Granulocytes 1  %   Abs Immature Granulocytes 0.08 (H) 0.00 - 0.07 K/uL    Comment: Performed at [facility], [address] [place], [place] [address]  Lipase, blood     Status: Abnormal   Collection Time: 10/13/22  5:01 AM  Result Value Ref Range   Lipase 179 (H) 11 - 51 U/L    Comment: Performed at [facility], [address] [place], [place] [address]  Magnesium     Status: None   Collection Time: 10/13/22  5:21 AM  Result Value Ref Range   Magnesium 1.7 1.7 - 2.4 mg/dL    Comment: Performed at [facility], [address] [place], [place] [address]  Pregnancy, urine     Status: None   Collection Time: 10/13/22  5:50 AM  Result Value Ref Range   Preg Test, Ur NEGATIVE NEGATIVE    Comment:        THE SENSITIVITY OF THIS METHODOLOGY IS >20 mIU/mL. Performed at [facility], [address] [place], [place] [address]   Urinalysis, Routine w reflex microscopic     Status: Abnormal   Collection Time: 10/13/22  5:50 AM  Result Value Ref Range   Color, Urine YELLOW YELLOW   APPearance HAZY (A) CLEAR   Specific Gravity, Urine 1.016 1.005 - 1.030   pH 6.0 5.0 - 8.0   Glucose, UA NEGATIVE NEGATIVE mg/dL   Hgb urine dipstick NEGATIVE NEGATIVE   Bilirubin Urine NEGATIVE NEGATIVE   Ketones, ur 20 (A) NEGATIVE mg/dL   Protein, ur 30 (A) NEGATIVE mg/dL   Nitrite NEGATIVE NEGATIVE   Leukocytes,Ua NEGATIVE NEGATIVE   RBC / HPF 0-5 0 - 5 RBC/hpf   WBC, UA 0-5 0 - 5 WBC/hpf   Bacteria, UA NONE SEEN NONE SEEN   Squamous Epithelial / HPF 6-10 0 - 5 /HPF   Mucus PRESENT    Hyaline Casts, UA PRESENT     Comment: Performed at [facility], [address] [place], [place] [address]   CT ABDOMEN PELVIS W CONTRAST  Result Date: 10/13/2022 CLINICAL DATA:  Abdominal pain. EXAM: CT ABDOMEN AND PELVIS WITH CONTRAST TECHNIQUE: Multidetector CT imaging of the abdomen and pelvis was performed using the standard protocol following bolus administration of intravenous contrast.  RADIATION DOSE REDUCTION: This exam was performed according to the departmental dose-optimization program which includes automated exposure control, adjustment of the mA and/or kV according to patient size and/or use of iterative reconstruction technique. CONTRAST:  9mL OMNIPAQUE IOHEXOL 350 MG/ML SOLN COMPARISON:  10/06/2022 FINDINGS: Lower chest: Small bilateral pleural effusions are new in the interval. Hepatobiliary: No suspicious focal abnormality within the liver parenchyma. Small area of low attenuation in the anterior liver, adjacent to the falciform ligament, is in a characteristic location for focal fatty deposition. Layering tiny calcified gallstones evident. No intrahepatic or extrahepatic biliary dilation. Pancreas: No focal mass lesion. No dilatation of the main duct. No intraparenchymal cyst. No peripancreatic edema. Spleen: No splenomegaly. No focal mass lesion. Adrenals/Urinary Tract: No adrenal nodule or mass. Kidneys unremarkable. No evidence for hydroureter. The urinary bladder appears normal for the degree of distention. Stomach/Bowel: Stomach is unremarkable. No gastric wall thickening. No evidence of outlet obstruction. Duodenum is normally positioned as is the ligament of Treitz. Jejunal loops are nondilated. There is a segment of ileum in the central pelvis there is dilated up to 4.7 cm diameter with dependent enterolith evident. As on the prior study, there is a 5-6 cm segment of distal ileum just proximal to the neo terminal ileum demonstrating circumferential wall thickening and perienteric edema/inflammation. This is immediately distal to the dilated segment consistent with inflammatory stricture. There is also a second area of circumferential wall thickening just proximal to the dilated segment (see image 64/3). Status post ileocecectomy. No gross colonic mass. No colonic wall thickening. Vascular/Lymphatic: No abdominal aortic aneurysm. No abdominal aortic atherosclerotic  calcification. There is no gastrohepatic or hepatoduodenal ligament lymphadenopathy. No retroperitoneal or mesenteric lymphadenopathy. No pelvic sidewall lymphadenopathy. Reproductive: Unremarkable. Other: No intraperitoneal free fluid. Musculoskeletal: No worrisome lytic or sclerotic osseous abnormality. Small umbilical hernia contains only fat. IMPRESSION: 1. 5-6 cm segment of distal ileum just proximal to the neo terminal ileum demonstrating circumferential wall thickening and perienteric edema/inflammation. This is immediately distal to a dilated segment consistent with inflammatory stricture. There is some fluid along the more distal inflammatory stricture, but is less focal than on the prior study and no rim enhancing or organized mesenteric abscess evident on today's exam. As before, there is also a second area of circumferential wall thickening just proximal to the dilated segment. Generally, imaging features suggest inflammatory bowel disease. 2. As on the prior study, there is a second segment of abnormal small bowel just proximal to the dilated segment. 3. Small bilateral pleural effusions are new in the interval. 4. Cholelithiasis. Electronically Signed   By: Kennith Center M.D.   On: 10/13/2022 09:33    Pending Labs Unresulted Labs (From admission, onward)     Start     Ordered   10/20/22 0500  Creatinine, serum  (enoxaparin (LOVENOX)    CrCl >/= 30 ml/min)  Weekly,   R     Comments: while on enoxaparin therapy    10/13/22 1520   10/14/22 0500  CBC  Tomorrow morning,   R        10/13/22 1520   10/14/22 0500  Comprehensive metabolic panel  Tomorrow morning,   R        10/13/22 1520            Vitals/Pain Today's Vitals   10/13/22 2030 10/13/22 2229 10/13/22 2230 10/13/22 2231  BP:  121/81    Pulse:  (!) 53    Resp:  19    Temp:   97.6  F (36.4 C)   TempSrc:   Oral   SpO2:  97%    Weight:      Height:      PainSc: 9    8     Isolation Precautions No active  isolations  Medications Medications  pantoprazole (PROTONIX) EC tablet 40 mg (has no administration in time range)  enoxaparin (LOVENOX) injection 40 mg (40 mg Subcutaneous Patient Refused/Not Given 10/13/22 1636)  sodium chloride flush (NS) 0.9 % injection 3 mL (3 mLs Intravenous Not Given 10/13/22 2217)  acetaminophen (TYLENOL) tablet 650 mg (has no administration in time range)    Or  acetaminophen (TYLENOL) suppository 650 mg (has no administration in time range)  0.9 %  sodium chloride infusion ( Intravenous New Bag/Given 10/13/22 1628)  polyethylene glycol (MIRALAX / GLYCOLAX) packet 17 g (has no administration in time range)  HYDROcodone-acetaminophen (NORCO/VICODIN) 5-325 MG per tablet 1 tablet (has no administration in time range)  HYDROmorphone (DILAUDID) injection 0.5-1 mg (1 mg Intravenous Given 10/13/22 2038)  metroNIDAZOLE (FLAGYL) IVPB 500 mg (has no administration in time range)  methylPREDNISolone sodium succinate (SOLU-MEDROL) 40 mg/mL injection 40 mg (has no administration in time range)  ramelteon (ROZEREM) tablet 8 mg (has no administration in time range)  traZODone (DESYREL) tablet 50 mg (has no administration in time range)  ondansetron (ZOFRAN-ODT) disintegrating tablet 8 mg (8 mg Oral Given 10/13/22 0531)  oxyCODONE-acetaminophen (PERCOCET/ROXICET) 5-325 MG per tablet 1 tablet (1 tablet Oral Given 10/13/22 0532)  iohexol (OMNIPAQUE) 350 MG/ML injection 75 mL (75 mLs Intravenous Contrast Given 10/13/22 0920)  potassium chloride SA (KLOR-CON M) CR tablet 40 mEq (40 mEq Oral Given 10/13/22 1431)  lactated ringers bolus 1,000 mL (0 mLs Intravenous Stopped 10/13/22 1720)  fentaNYL (SUBLIMAZE) injection 50 mcg (50 mcg Intravenous Given 10/13/22 1432)  metroNIDAZOLE (FLAGYL) IVPB 500 mg (0 mg Intravenous Stopped 10/13/22 1750)  methylPREDNISolone sodium succinate (SOLU-MEDROL) 40 mg/mL injection 40 mg (40 mg Intravenous Given 10/13/22 1628)    Mobility walks     Focused  Assessments Cardiac Assessment Handoff:  Cardiac Rhythm: Sinus bradycardia No results found for: "CKTOTAL", "CKMB", "CKMBINDEX", "TROPONINI" Lab Results  Component Value Date   DDIMER 3.48 (H) 10/06/2022   Does the Patient currently have chest pain? No    R Recommendations: See Admitting Provider Note  Report given to:   Additional Notes:

## 2022-10-13 NOTE — ED Provider Notes (Signed)
[facility] EMERGENCY DEPARTMENT Provider Note   CSN: [id number] Arrival date & time: 10/13/22  0058     History  Chief Complaint  Patient presents with   Abdominal Pain   Emesis    Sharon [name] is a 42 y.o. female.   Abdominal Pain Associated symptoms: vomiting   Emesis Associated symptoms: abdominal pain      42 year old female with a hx of crohn's disease and stenosed bowel resulting in symptoms of partial obstruction presenting to the ER with persistent nausea, vomiting and abdominal pain. The patient states that she was recently admitted to [facility] for Crohn's flare and intraabdominal abscess treated with antibiotics. She left AMA on 1/14. She presents today with persistent symptoms. She is unable to keep down liquids or solids. Additionally, she feels lightheaded and near syncopal every time she stands. She has had decreased PO intake over that time. She denies any fevers or chills, states she has been feeling 'hot flashes' over the past few days. No blood in her vomit or stool. No shortness of breath or chest pain.   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 Koleen Distance with GI and Dr. Morton Stall with general surgery at [facility]) also complicated by chronic abdominal pain (follows wtih Dr. Burton Apley with [facility] pain management).  She is status post ileocecectomy in 2004.  Her last EGD/colonoscopy 07/2001 revealed mild gastritis as well as a stenosed ileocolonic anastomosis in the ascending colon with normal terminal ileum.     Patient presents to [facility] emergency department with complaints of abdominal pain nausea and vomiting.  CT imaging of the abdomen pelvis was performed in the emergency department revealing 8 cm segment of markedly severe wall thickening of the distal  ileum associated with ulceration thought to be sequela of her severe active Crohn's disease with a fluid collection along the margin of that loop of bowel measuring 4.9 x 1.5 x 1.7 cm suspicious for small abscess formation.  EDP discussed case with Dr. Watt Climes with [facility] gastroenterology who recommended hospitalization with GI consultation in the morning.  The hospitalist group was then called to assess the patient for admission to the hospital.   Patient was admitted to the hospitalist service and under the guidance of gastroenterology was placed on a combination of systemic steroids and intravenous antibiotic therapy.  Patient was initially kept n.p.o. but in the days that followed gradually had her diet advanced to a clear and then a full liquid diet.   Despite these measures, clinical improvement was slow with patient continuing to have significant abdominal pain.   Unfortunately, patient decided to leave [place] the evening of 10/10/2022.  Patient was advised of the risks of morbidity and mortality with patient voicing her understanding of these increased risks at time of discharge and signing all appropriate paperwork.  Patient was advised to seek attention with her primary care provider and gastroenterologist to soon as possible and to seek medical attention in local emergency department if she clinically worsened."  Home Medications Prior to Admission medications   Medication Sig Start Date End Date Taking? Authorizing Provider  amoxicillin-clavulanate (AUGMENTIN) 875-125 MG tablet Take 1 tablet by mouth 2 (two) times daily. 10 day course.   Yes [provider]  loperamide (IMODIUM A-D) 2 MG tablet Take 2 mg by mouth daily as needed for diarrhea or loose  stools.   Yes [provider]  Norethin Ace-Eth Estrad-FE (JUNEL FE 24 PO) Take 1 tablet by mouth every evening.   Yes [provider]  omeprazole (PRILOSEC) 20 MG capsule Take 20 mg by mouth 2 (two) times  daily before a meal.   Yes [provider]  sertraline (ZOLOFT) 100 MG tablet Take 200 mg by mouth every evening. 09/28/22  Yes [provider]  Turmeric (QC TUMERIC COMPLEX PO) Take 3,000 mg by mouth every evening.   Yes [provider]  ustekinumab (STELARA) 90 MG/ML SOSY injection Inject 90 mg into the skin every 8 (eight) weeks.   Yes [provider]  estradiol-norethindrone (COMBIPATCH) 0.05-0.14 MG/DAY Place 1 patch onto the skin 2 (two) times a week. Patient not taking: Reported on 10/13/2022 04/01/22   Shamleffer, Melanie Crazier, MD      Allergies    Other    Review of Systems   Review of Systems  Gastrointestinal:  Positive for abdominal pain and vomiting.    Physical Exam Updated Vital Signs BP 125/85   Pulse (!) 52   Temp 98.1 F (36.7 C) (Oral)   Resp 18   Ht 5\' 6"  (1.676 m)   Wt 86.4 kg   LMP 08/26/2022 (Approximate) Comment: periods are not normal per patient  SpO2 94%   BMI 30.74 kg/m  Physical Exam Vitals and nursing note reviewed.  Constitutional:      General: She is not in acute distress.    Appearance: She is well-developed.  HENT:     Head: Normocephalic and atraumatic.     Mouth/Throat:     Mouth: Mucous membranes are dry.  Eyes:     Conjunctiva/sclera: Conjunctivae normal.  Cardiovascular:     Rate and Rhythm: Normal rate and regular rhythm.     Heart sounds: No murmur heard. Pulmonary:     Effort: Pulmonary effort is normal. No respiratory distress.     Breath sounds: Normal breath sounds.  Abdominal:     Palpations: Abdomen is soft.     Tenderness: There is generalized abdominal tenderness. There is no guarding or rebound.  Musculoskeletal:        General: No swelling.     Cervical back: Neck supple.  Skin:    General: Skin is warm and dry.     Capillary Refill: Capillary refill takes less than 2 seconds.  Neurological:     Mental Status: She is alert.  Psychiatric:        Mood and Affect: Mood normal.      ED Results / Procedures / Treatments   Labs (all labs ordered are listed, but only abnormal results are displayed) Labs Reviewed  COMPREHENSIVE METABOLIC PANEL - Abnormal; Notable for the following components:      Result Value   Potassium 3.0 (*)    BUN <5 (*)    Creatinine, Ser 1.14 (*)    Calcium 8.5 (*)    Total Protein 6.4 (*)    Albumin 3.0 (*)    AST 13 (*)    Alkaline Phosphatase 32 (*)    All other components within normal limits  CBC WITH DIFFERENTIAL/PLATELET - Abnormal; Notable for the following components:   WBC 11.6 (*)    RBC 3.50 (*)    Hemoglobin 10.5 (*)    HCT 30.9 (*)    Neutro Abs 9.5 (*)    Abs Immature Granulocytes 0.08 (*)    All other components within normal limits  LIPASE, BLOOD -  Abnormal; Notable for the following components:   Lipase 179 (*)    All other components within normal limits  URINALYSIS, ROUTINE W REFLEX MICROSCOPIC - Abnormal; Notable for the following components:   APPearance HAZY (*)    Ketones, ur 20 (*)    Protein, ur 30 (*)    All other components within normal limits  PREGNANCY, URINE  MAGNESIUM  CBC  COMPREHENSIVE METABOLIC PANEL    EKG None  Radiology CT ABDOMEN PELVIS W CONTRAST  Result Date: 10/13/2022 CLINICAL DATA:  Abdominal pain. EXAM: CT ABDOMEN AND PELVIS WITH CONTRAST TECHNIQUE: Multidetector CT imaging of the abdomen and pelvis was performed using the standard protocol following bolus administration of intravenous contrast. RADIATION DOSE REDUCTION: This exam was performed according to the departmental dose-optimization program which includes automated exposure control, adjustment of the mA and/or kV according to patient size and/or use of iterative reconstruction technique. CONTRAST:  37mL OMNIPAQUE IOHEXOL 350 MG/ML SOLN COMPARISON:  10/06/2022 FINDINGS: Lower chest: Small bilateral pleural effusions are new in the interval. Hepatobiliary: No suspicious focal abnormality within the liver parenchyma. Small  area of low attenuation in the anterior liver, adjacent to the falciform ligament, is in a characteristic location for focal fatty deposition. Layering tiny calcified gallstones evident. No intrahepatic or extrahepatic biliary dilation. Pancreas: No focal mass lesion. No dilatation of the main duct. No intraparenchymal cyst. No peripancreatic edema. Spleen: No splenomegaly. No focal mass lesion. Adrenals/Urinary Tract: No adrenal nodule or mass. Kidneys unremarkable. No evidence for hydroureter. The urinary bladder appears normal for the degree of distention. Stomach/Bowel: Stomach is unremarkable. No gastric wall thickening. No evidence of outlet obstruction. Duodenum is normally positioned as is the ligament of Treitz. Jejunal loops are nondilated. There is a segment of ileum in the central pelvis there is dilated up to 4.7 cm diameter with dependent enterolith evident. As on the prior study, there is a 5-6 cm segment of distal ileum just proximal to the neo terminal ileum demonstrating circumferential wall thickening and perienteric edema/inflammation. This is immediately distal to the dilated segment consistent with inflammatory stricture. There is also a second area of circumferential wall thickening just proximal to the dilated segment (see image 64/3). Status post ileocecectomy. No gross colonic mass. No colonic wall thickening. Vascular/Lymphatic: No abdominal aortic aneurysm. No abdominal aortic atherosclerotic calcification. There is no gastrohepatic or hepatoduodenal ligament lymphadenopathy. No retroperitoneal or mesenteric lymphadenopathy. No pelvic sidewall lymphadenopathy. Reproductive: Unremarkable. Other: No intraperitoneal free fluid. Musculoskeletal: No worrisome lytic or sclerotic osseous abnormality. Small umbilical hernia contains only fat. IMPRESSION: 1. 5-6 cm segment of distal ileum just proximal to the neo terminal ileum demonstrating circumferential wall thickening and perienteric  edema/inflammation. This is immediately distal to a dilated segment consistent with inflammatory stricture. There is some fluid along the more distal inflammatory stricture, but is less focal than on the prior study and no rim enhancing or organized mesenteric abscess evident on today's exam. As before, there is also a second area of circumferential wall thickening just proximal to the dilated segment. Generally, imaging features suggest inflammatory bowel disease. 2. As on the prior study, there is a second segment of abnormal small bowel just proximal to the dilated segment. 3. Small bilateral pleural effusions are new in the interval. 4. Cholelithiasis. Electronically Signed   By: Misty Stanley M.D.   On: 10/13/2022 09:33    Procedures Procedures    Medications Ordered in ED Medications  pantoprazole (PROTONIX) EC tablet 40 mg (has no administration in time range)  enoxaparin (LOVENOX) injection 40 mg (40 mg Subcutaneous Patient Refused/Not Given 10/13/22 1636)  sodium chloride flush (NS) 0.9 % injection 3 mL (3 mLs Intravenous Not Given 10/13/22 1630)  acetaminophen (TYLENOL) tablet 650 mg (has no administration in time range)    Or  acetaminophen (TYLENOL) suppository 650 mg (has no administration in time range)  0.9 %  sodium chloride infusion ( Intravenous New Bag/Given 10/13/22 1628)  polyethylene glycol (MIRALAX / GLYCOLAX) packet 17 g (has no administration in time range)  HYDROcodone-acetaminophen (NORCO/VICODIN) 5-325 MG per tablet 1 tablet (has no administration in time range)  HYDROmorphone (DILAUDID) injection 0.5-1 mg (has no administration in time range)  metroNIDAZOLE (FLAGYL) IVPB 500 mg (has no administration in time range)  methylPREDNISolone sodium succinate (SOLU-MEDROL) 40 mg/mL injection 40 mg (has no administration in time range)  ramelteon (ROZEREM) tablet 8 mg (has no administration in time range)  traZODone (DESYREL) tablet 50 mg (has no administration in time range)   ondansetron (ZOFRAN-ODT) disintegrating tablet 8 mg (8 mg Oral Given 10/13/22 0531)  oxyCODONE-acetaminophen (PERCOCET/ROXICET) 5-325 MG per tablet 1 tablet (1 tablet Oral Given 10/13/22 0532)  iohexol (OMNIPAQUE) 350 MG/ML injection 75 mL (75 mLs Intravenous Contrast Given 10/13/22 0920)  potassium chloride SA (KLOR-CON M) CR tablet 40 mEq (40 mEq Oral Given 10/13/22 1431)  lactated ringers bolus 1,000 mL (0 mLs Intravenous Stopped 10/13/22 1720)  fentaNYL (SUBLIMAZE) injection 50 mcg (50 mcg Intravenous Given 10/13/22 1432)  metroNIDAZOLE (FLAGYL) IVPB 500 mg (0 mg Intravenous Stopped 10/13/22 1750)  methylPREDNISolone sodium succinate (SOLU-MEDROL) 40 mg/mL injection 40 mg (40 mg Intravenous Given 10/13/22 1628)    ED Course/ Medical Decision Making/ A&P Clinical Course as of 10/13/22 2005  Wed Oct 13, 2022  1424 Lipase(!): 179 [JL]  1424 Potassium(!): 3.0 [JL]    Clinical Course User Index [JL] Regan Lemming, MD                             Medical Decision Making Amount and/or Complexity of Data Reviewed Labs: ordered. Decision-making details documented in ED Course.  Risk Prescription drug management. Decision regarding hospitalization.   42 year old female with a hx of crohn's disease and stenosed bowel resulting in symptoms of partial obstruction presenting to the ER with persistent nausea, vomiting and abdominal pain. The patient states that she was recently admitted to [facility] for Crohn's flare and intraabdominal abscess treated with antibiotics. She left AMA on 1/14. She presents today with persistent symptoms. She is unable to keep down liquids or solids. Additionally, she feels lightheaded and near syncopal every time she stands. She has had decreased PO intake over that time. She denies any fevers or chills, states she has been feeling 'hot flashes' over the past few days. No blood in her vomit or stool. No shortness of breath or chest pain.   On arrival, the patient was  vitally stable. Physical exam with dry mucous membranes and generalized abdominal tenderness to palpation.   Ddx: persistent abdominal abscess, Crohn's flare, SBO, partial SBO, pancreatitis, less likely other intraabdominal emergency.   Initial laboratory evaluation significant for UA with ketones and proteinuria, negative for UTI. Lipase 179, mg 1.7, CMP with K 3.0, Cr 1.14, Ca 8.5 normal after correcting for albumin 3.0, CBC with a leukocytosis of 11.6, anemia to 10.5 improved from prior measurements.   CT Abdomen Pelvis: IMPRESSION:  1. 5-6 cm segment of distal ileum just proximal to the neo terminal  ileum demonstrating circumferential wall thickening and perienteric  edema/inflammation. This is immediately distal to a dilated segment  consistent with inflammatory stricture. There is some fluid along  the more distal inflammatory stricture, but is less focal than on  the prior study and no rim enhancing or organized mesenteric abscess  evident on today's exam. As before, there is also a second area of  circumferential wall thickening just proximal to the dilated  segment. Generally, imaging features suggest inflammatory bowel  disease.  2. As on the prior study, there is a second segment of abnormal  small bowel just proximal to the dilated segment.  3. Small bilateral pleural effusions are new in the interval.  4. Cholelithiasis.    Despite improvement on CT, pt with persistent symptoms of a Crohn's flare, persistent pain, decreased PO intake. Pt with positive orthostatics, suspect orthostatic hypotension and near syncope in the setting of dehydration. Will treat with pain medication, antiemetics, Solumedrol and Flagyl. [facility] consulted, will see the patient inpatient. Hospitalist medicine consulted for admission and Dr. Alinda Money accepted the patient in admission. Pt and family updated on the plan of care.   Final Clinical Impression(s) / ED Diagnoses Final diagnoses:  Crohn's  colitis, unspecified complication (HCC)  Nausea and vomiting, unspecified vomiting type  Near syncope  Orthostatic hypotension  Dehydration    Rx / DC Orders ED Discharge Orders     None         Ernie Avena, MD 10/13/22 2005

## 2022-10-13 NOTE — H&P (Signed)
History and Physical   Sharon [name] PIR:[id number] DOB: 09/07/1981 DOA: 10/13/2022  PCP: Patient, No Pcp Per   Patient coming from: Home  Chief Complaint: Abdominal pain, vomiting  HPI: Sharon [name] is a 42 y.o. female with medical history significant of Graves' disease with history of surgical intervention and recurrent colitis, anemia presenting with abdominal pain and vomiting.  Patient has known Crohn's disease and has some degree of abdominal pain with nausea and vomiting for about a month.  Patient was recently admitted to [facility] long hospital from 1/10 until 1/14.  Crohn's colitis at that time with abscess noted and had slow improvement on IV steroids and IV antibiotics with consultation by gastroenterology.  Diet was advanced while there but had continued pain and ultimately left AMA.  Since leaving, patient had continued abdominal pain and decreased p.o. intake with several episodes of lightheadedness/near syncope.  She denies fevers, chills, chest pain, shortness of breath.  ED Course: Vital signs in the ED stable.  Lab workup included CMP with potassium 3.0, creatinine of 1.14 from baseline of 0.9, calcium 8.5, protein 6.4, albumin 3.0.  CBC with leukocytosis 11.6 and hemoglobin stable at 10.5.  Lipase elevated to 179.  Urinalysis with ketones and protein only.  CT abdomen pelvis showed significant inflammation and pericolonic fluid without evidence of abscess at this time.  Consistent with Crohn's colitis/Crohn's failure.  Magnesium level normal.  Patient received oxycodone, fentanyl, Flagyl, Solu-Medrol, Zofran, liter of IV fluids and 40 mill equivalents of p.o. potassium in the ED.  GI consulted and sounds like will see the patient tomorrow.  Review of Systems: As per HPI otherwise all other systems reviewed and are negative.  Past Medical History:  Diagnosis Date   Crohn's disease (HCC)    dx 1996   History of abnormal cervical Pap smear    History of genital warts     Infertility    Pelvic peritoneal adhesions, female    Perirectal abscess     Past Surgical History:  Procedure Laterality Date   bladder perforation     COLONOSCOPY  last one 02-25-2011   LAPAROSCOPIC LYSIS INTESTINAL ADHESIONS     RIGHT HEMICOLECTOMY/ ANASTOMOSIS/  REPAIR ABDOMINAL WALL HERNIA/  RIGHT SALPINOOPHORECTOMY  01/ 2005   CROHN'S    Social History  reports that she has an unknown smoking status. She has never used smokeless tobacco. She reports that she does not drink alcohol and does not use drugs.  No Known Allergies  Family History  Problem Relation Age of Onset   Healthy Brother   Reviewed on admission  Prior to Admission medications   Medication Sig Start Date End Date Taking? Authorizing Provider  citalopram (CELEXA) 20 MG tablet Take 40 mg by mouth daily.  Patient not taking: Reported on 03/31/2022    [provider]  estradiol-norethindrone [facility]) 0.05-0.14 MG/DAY Place 1 patch onto the skin 2 (two) times a week. 04/01/22   Shamleffer, Konrad Dolores, MD  loperamide (IMODIUM) 2 MG capsule Take 1 capsule (2 mg total) by mouth 4 (four) times daily as needed for diarrhea or loose stools. Patient not taking: Reported on 03/31/2022 09/25/15   Derwood Kaplan, MD  Norethin Ace-Eth Estrad-FE (JUNEL FE 24 PO) Take 1 tablet by mouth daily.    [provider]  omeprazole (PRILOSEC) 20 MG capsule Take 20 mg by mouth 2 (two) times daily before a meal.    [provider]  sertraline (ZOLOFT) 100 MG tablet Take 100 mg by mouth 2 (two)  times daily. Patient not taking: Reported on 10/07/2022 09/28/22   [provider]  ustekinumab (STELARA) 90 MG/ML SOSY injection Inject 90 mg into the skin every 8 (eight) weeks.    [provider]    Physical Exam: Vitals:   10/13/22 0452 10/13/22 0504 10/13/22 1012 10/13/22 1434  BP:  100/71 104/71 120/84  Pulse:  (!) 116 83 (!) 52  Resp:  20 18 18   Temp: 98 F (36.7 C)  97.8 F (36.6 C) 98  F (36.7 C)  TempSrc:   Oral   SpO2:  94% 98% 98%  Weight:      Height:        Physical Exam Constitutional:      General: She is not in acute distress.    Appearance: Normal appearance.  HENT:     Head: Normocephalic and atraumatic.     Mouth/Throat:     Mouth: Mucous membranes are moist.     Pharynx: Oropharynx is clear.  Eyes:     Extraocular Movements: Extraocular movements intact.     Pupils: Pupils are equal, round, and reactive to light.  Cardiovascular:     Rate and Rhythm: Normal rate and regular rhythm.     Pulses: Normal pulses.     Heart sounds: Normal heart sounds.  Pulmonary:     Effort: Pulmonary effort is normal. No respiratory distress.     Breath sounds: Normal breath sounds.  Abdominal:     General: Bowel sounds are normal. There is no distension.     Palpations: Abdomen is soft.     Tenderness: There is abdominal tenderness.  Musculoskeletal:        General: No swelling or deformity.  Skin:    General: Skin is warm and dry.  Neurological:     General: No focal deficit present.     Mental Status: Mental status is at baseline.    Labs on Admission: I have personally reviewed following labs and imaging studies  CBC: Recent Labs  Lab 10/07/22 0526 10/08/22 0930 10/10/22 0522 10/13/22 0501  WBC 4.0 6.5 7.5 11.6*  NEUTROABS  --  5.1 5.8 9.5*  HGB 9.7* 8.7* 8.9* 10.5*  HCT 30.4* 26.9* 27.1* 30.9*  MCV 90.2 90.6 91.6 88.3  PLT 312 291 228 505    Basic Metabolic Panel: Recent Labs  Lab 10/07/22 0526 10/08/22 0930 10/09/22 0618 10/10/22 0522 10/13/22 0501 10/13/22 0521  NA 142 139 138 140 138  --   K 3.7 3.8 4.3 4.1 3.0*  --   CL 105 105 107 106 101  --   CO2 27 24 23 25 25   --   GLUCOSE 143* 136* 124* 120* 90  --   BUN 14 12 10 11  <5*  --   CREATININE 0.92 1.00 0.86 1.04* 1.14*  --   CALCIUM 8.7* 8.6* 8.5* 8.6* 8.5*  --   MG  --  2.2 2.1 2.1  --  1.7    GFR: Estimated Creatinine Clearance: 72.6 mL/min (A) (by C-G formula based  on SCr of 1.14 mg/dL (H)).  Liver Function Tests: Recent Labs  Lab 10/07/22 0526 10/08/22 0930 10/09/22 0618 10/10/22 0522 10/13/22 0501  AST 13* 22 13* 16 13*  ALT 14 13 14 16 16   ALKPHOS 42 31* 35* 33* 32*  BILITOT 0.8 0.6 0.5 0.5 0.8  PROT 7.1 6.1* 6.4* 6.6 6.4*  ALBUMIN 3.1* 2.9* 2.9* 2.8* 3.0*    Urine analysis:    Component Value Date/Time  COLORURINE YELLOW 10/13/2022 0550   APPEARANCEUR HAZY (A) 10/13/2022 0550   LABSPEC 1.016 10/13/2022 0550   PHURINE 6.0 10/13/2022 0550   GLUCOSEU NEGATIVE 10/13/2022 0550   HGBUR NEGATIVE 10/13/2022 0550   BILIRUBINUR NEGATIVE 10/13/2022 0550   KETONESUR 20 (A) 10/13/2022 0550   PROTEINUR 30 (A) 10/13/2022 0550   UROBILINOGEN 0.2 04/22/2008 1216   NITRITE NEGATIVE 10/13/2022 0550   LEUKOCYTESUR NEGATIVE 10/13/2022 0550    Radiological Exams on Admission: CT ABDOMEN PELVIS W CONTRAST  Result Date: 10/13/2022 CLINICAL DATA:  Abdominal pain. EXAM: CT ABDOMEN AND PELVIS WITH CONTRAST TECHNIQUE: Multidetector CT imaging of the abdomen and pelvis was performed using the standard protocol following bolus administration of intravenous contrast. RADIATION DOSE REDUCTION: This exam was performed according to the departmental dose-optimization program which includes automated exposure control, adjustment of the mA and/or kV according to patient size and/or use of iterative reconstruction technique. CONTRAST:  59mL OMNIPAQUE IOHEXOL 350 MG/ML SOLN COMPARISON:  10/06/2022 FINDINGS: Lower chest: Small bilateral pleural effusions are new in the interval. Hepatobiliary: No suspicious focal abnormality within the liver parenchyma. Small area of low attenuation in the anterior liver, adjacent to the falciform ligament, is in a characteristic location for focal fatty deposition. Layering tiny calcified gallstones evident. No intrahepatic or extrahepatic biliary dilation. Pancreas: No focal mass lesion. No dilatation of the main duct. No intraparenchymal  cyst. No peripancreatic edema. Spleen: No splenomegaly. No focal mass lesion. Adrenals/Urinary Tract: No adrenal nodule or mass. Kidneys unremarkable. No evidence for hydroureter. The urinary bladder appears normal for the degree of distention. Stomach/Bowel: Stomach is unremarkable. No gastric wall thickening. No evidence of outlet obstruction. Duodenum is normally positioned as is the ligament of Treitz. Jejunal loops are nondilated. There is a segment of ileum in the central pelvis there is dilated up to 4.7 cm diameter with dependent enterolith evident. As on the prior study, there is a 5-6 cm segment of distal ileum just proximal to the neo terminal ileum demonstrating circumferential wall thickening and perienteric edema/inflammation. This is immediately distal to the dilated segment consistent with inflammatory stricture. There is also a second area of circumferential wall thickening just proximal to the dilated segment (see image 64/3). Status post ileocecectomy. No gross colonic mass. No colonic wall thickening. Vascular/Lymphatic: No abdominal aortic aneurysm. No abdominal aortic atherosclerotic calcification. There is no gastrohepatic or hepatoduodenal ligament lymphadenopathy. No retroperitoneal or mesenteric lymphadenopathy. No pelvic sidewall lymphadenopathy. Reproductive: Unremarkable. Other: No intraperitoneal free fluid. Musculoskeletal: No worrisome lytic or sclerotic osseous abnormality. Small umbilical hernia contains only fat. IMPRESSION: 1. 5-6 cm segment of distal ileum just proximal to the neo terminal ileum demonstrating circumferential wall thickening and perienteric edema/inflammation. This is immediately distal to a dilated segment consistent with inflammatory stricture. There is some fluid along the more distal inflammatory stricture, but is less focal than on the prior study and no rim enhancing or organized mesenteric abscess evident on today's exam. As before, there is also a second  area of circumferential wall thickening just proximal to the dilated segment. Generally, imaging features suggest inflammatory bowel disease. 2. As on the prior study, there is a second segment of abnormal small bowel just proximal to the dilated segment. 3. Small bilateral pleural effusions are new in the interval. 4. Cholelithiasis. Electronically Signed   By: Kennith Center M.D.   On: 10/13/2022 09:33    EKG: Not yet performed in the ED.  Assessment/Plan Principal Problem:   Crohn's colitis (HCC) Active Problems:   Dehydration  Normocytic anemia   Near syncope   Orthostatic hypotension   Crohn's colitis > Patient with known history of Crohn's disease with prior ilio seek ectomy in 2004.  Also has known stricture and peritoneal disease history.  Has GI, general surgery, pain specialist at [place].  Per chart review she has previously declined surgical intervention for her strictures.  She reports she has some interest in changing her providers possibly to different academic center. > She has had plans to start [place], not yet started.  > As per HPI was admitted earlier this month at [facility] long and treated for Crohn's flare with abscess but left AMA prior to completion of treatment. > Has continued pain with vomiting and decreased p.o. intake as well as near syncope as below. > In the ED mild leukocytosis to 11.6.  CT with continued evidence of Crohn's colitis but no evidence of abscess at this time. > Case was discussed with [facility] gastroenterology who saw patient at [facility] long in the ED.  It appears they will see the patient while admitted but possibly tomorrow. - Appreciate GI assistance with this patient - Monitor on telemetry - IV fluids - Continue with every 12 hour Flagyl and Solu-Medrol - N.p.o. with ice chips and sips, advance as tolerated - Pain control as needed with hydrocodone for moderate pain and Dilaudid for severe pain  Near syncope > Patient with decreased p.o.  intake reporting near syncopal episodes recently had positive orthostatics in the ED.  From sitting to standing diastolic dropped from 77 to 58 mmHg and heart rate from sitting to standing went from 84-115. - Continue with IV fluids  Hypokalemia > Potassium noted to be 3.0 in the ED with normal magnesium.  40 mill equivalents have been ordered. - Continue to trend renal function and electrolytes  Anemia > Hemoglobin stable at 10.5 in ED - Trend CBC.  DVT prophylaxis: Lovenox Code Status:   Full, discussed with patient Family Communication:  None on admission. Disposition Plan:   Patient is from:  Home  Anticipated DC to:  Home  Anticipated DC date:  1 to 4 days  Anticipated DC barriers: None  Consults called:  Gastroenterology, consulted in the ED, have not seen the patient yet may need to reconsult if they have not seen the patient by tomorrow morning. Admission status:  Observation, telemetry  Severity of Illness: The appropriate patient status for this patient is OBSERVATION. Observation status is judged to be reasonable and necessary in order to provide the required intensity of service to ensure the patient's safety. The patient's presenting symptoms, physical exam findings, and initial radiographic and laboratory data in the context of their medical condition is felt to place them at decreased risk for further clinical deterioration. Furthermore, it is anticipated that the patient will be medically stable for discharge from the hospital within 2 midnights of admission.    Marcelyn Bruins MD [facility]  How to contact the [facility] Attending or Consulting provider [place] or covering provider during after hours [place], for this patient?   Check the care team in [facility] and look for a) attending/consulting [facility] provider listed and b) the [facility] team listed Log into [email or web address] and use [place]'s universal password to access. If you do not have the password, please contact the  hospital operator. Locate the [facility] provider you are looking for under [facility] and page to a number that you can be directly reached. If you still have difficulty reaching  the provider, please page the [facility] (Director on Call) for the Hospitalists listed on amion for assistance.  10/13/2022, 3:26 PM

## 2022-10-13 NOTE — ED Provider Triage Note (Signed)
Emergency Medicine Provider Triage Evaluation Note  Sharon [name] , a 42 y.o. female  was evaluated in triage.  Pt complains of abdominal pain going on for last couple days, has a history of Crohn's disease, was recently seen at [facility] emergency department and was admitted, unfortunately patient left AMA, there is concerns at 1 time that she had a abdominal abscess, she started on IV antibiotics as well as steroids, she is being followed by GI.  States that she was having nausea and vomiting as well as constipation there is been no bloody emesis or bloody stools no coffee-ground emesis or melena..  Review of Systems  Positive: Abdominal pains nausea vomiting Negative: Chest pain, shortness of breath  Physical Exam  BP 100/71 (BP Location: Right Arm)   Pulse (!) 116   Temp 98 F (36.7 C)   Resp 20   Ht 5\' 6"  (1.676 m)   Wt 86.4 kg   LMP 08/26/2022 (Approximate) Comment: periods are not normal per patient  SpO2 94%   BMI 30.74 kg/m  Gen:   Awake, no distress   Resp:  Normal effort  MSK:   Moves extremities without difficulty  Other:    Medical Decision Making  Medically screening exam initiated at 5:19 AM.  Appropriate orders placed.  Sharon [name] was informed that the remainder of the evaluation will be completed by another provider, this initial triage assessment does not replace that evaluation, and the importance of remaining in the ED until their evaluation is complete.  Lab work imaging been ordered will need further workup.   Sharon Fennel, PA-C 10/13/22 [phone]

## 2022-10-13 NOTE — ED Notes (Signed)
Pt requested to remain off cardiac monitors, states she has an allergy to the electrode stickers. Pt agreed to have vitals obtained as needed. VSS at this time. MD made aware

## 2022-10-14 DIAGNOSIS — K50119 Crohn's disease of large intestine with unspecified complications: Secondary | ICD-10-CM | POA: Diagnosis not present

## 2022-10-14 LAB — CBC
HCT: 31.1 % — ABNORMAL LOW (ref 36.0–46.0)
Hemoglobin: 10.6 g/dL — ABNORMAL LOW (ref 12.0–15.0)
MCH: 29.9 pg (ref 26.0–34.0)
MCHC: 34.1 g/dL (ref 30.0–36.0)
MCV: 87.9 fL (ref 80.0–100.0)
Platelets: 331 10*3/uL (ref 150–400)
RBC: 3.54 MIL/uL — ABNORMAL LOW (ref 3.87–5.11)
RDW: 13.8 % (ref 11.5–15.5)
WBC: 9.2 10*3/uL (ref 4.0–10.5)
nRBC: 0 % (ref 0.0–0.2)

## 2022-10-14 LAB — COMPREHENSIVE METABOLIC PANEL
ALT: 16 U/L (ref 0–44)
AST: 14 U/L — ABNORMAL LOW (ref 15–41)
Albumin: 2.9 g/dL — ABNORMAL LOW (ref 3.5–5.0)
Alkaline Phosphatase: 34 U/L — ABNORMAL LOW (ref 38–126)
Anion gap: 10 (ref 5–15)
BUN: 8 mg/dL (ref 6–20)
CO2: 22 mmol/L (ref 22–32)
Calcium: 8.5 mg/dL — ABNORMAL LOW (ref 8.9–10.3)
Chloride: 104 mmol/L (ref 98–111)
Creatinine, Ser: 1.01 mg/dL — ABNORMAL HIGH (ref 0.44–1.00)
GFR, Estimated: >60 mL/min (ref >60)
Glucose, Bld: 139 mg/dL — ABNORMAL HIGH (ref 70–99)
Potassium: 4.2 mmol/L (ref 3.5–5.1)
Sodium: 136 mmol/L (ref 135–145)
Total Bilirubin: 1 mg/dL (ref 0.3–1.2)
Total Protein: 6.5 g/dL (ref 6.5–8.1)

## 2022-10-14 MED ORDER — SODIUM CHLORIDE 0.9 % IV SOLN: INTRAVENOUS | Status: DC

## 2022-10-14 MED ORDER — SODIUM CHLORIDE 0.9 % IV SOLN
2.0000 g | Freq: Three times a day (TID) | INTRAVENOUS | Status: AC
  Administered 2022-10-14 – 2022-10-15 (×3): 2 g via INTRAVENOUS
  Filled 2022-10-14 (×3): qty 12.5

## 2022-10-14 MED ORDER — HYDROMORPHONE HCL 1 MG/ML IJ SOLN
0.5000 mg | INTRAMUSCULAR | Status: DC | PRN
  Administered 2022-10-14 – 2022-10-15 (×4): 0.5 mg via INTRAVENOUS
  Filled 2022-10-14 (×4): qty 0.5

## 2022-10-14 NOTE — Progress Notes (Signed)
Patient refused to be on telemetry,stated she gets skin issues ,patient educated ,On call provider informed,will continue to monitor.

## 2022-10-14 NOTE — Progress Notes (Signed)
**Sharon Sharon** Sharon Sharon  Sharon Sharon 42 y.o. Feb 01, 1981  CC: Crohn's exacerbation   Subjective: This is a 42 year old patient with complicated Crohn's disease.  Please see recent GI consult Sharon for details.  Patient is followed by Dr. Koleen Distance at [facility]  History of fibrostenotic Crohn's disease complicated by ileal stricture and abscess.  She has tried Humira, and Regulatory affairs officer.  Plan was to start her on Skyrizi.  Was admitted to the hospital recently.  Was started on IV steroid and antibiotics.  She subsequently left AMA.  Came back yesterday with worsening symptoms.  Complaining of bilateral lower quadrant pain.  Repeat CT abdomen pelvis with IV contrast showed resolution of abscess but persistent dilated portion of distal ileum distal to inflammatory stricture.  She is feeling better now.  Denies any vomiting admission.  Last bowel movement was 2 days ago.  Passing gas.   ROS :afebrile, negative for chest pain.  Positive for weakness.  Positive for fatigue.   Objective: Vital signs in last 24 hours: Vitals:   10/14/22 0007 10/14/22 0442  BP: 104/79 131/78  Pulse: 92 (!) 50  Resp:    Temp: (!) 97.4 F (36.3 C) 97.7 F (36.5 C)  SpO2: 100% 97%    Physical Exam: Resting comfortably: Not in acute distress.  She is alert and oriented x 3.  Somewhat anxious but otherwise mood and affect normal.  Abdomen is minimally distended.  Bowel sounds present.  No peritoneal signs.  Lab Results: Recent Labs    10/13/22 0501 10/13/22 0521 10/14/22 0324  NA 138  --  136  K 3.0*  --  4.2  CL 101  --  104  CO2 25  --  22  GLUCOSE 90  --  139*  BUN <5*  --  8  CREATININE 1.14*  --  1.01*  CALCIUM 8.5*  --  8.5*  MG  --  1.7  --    Recent Labs    10/13/22 0501 10/14/22 0324  AST 13* 14*  ALT 16 16  ALKPHOS 32* 34*  BILITOT 0.8 1.0  PROT 6.4* 6.5  ALBUMIN 3.0* 2.9*   Recent Labs    10/13/22 0501 10/14/22 0324  WBC 11.6* 9.2  NEUTROABS 9.5*  --   HGB 10.5*  10.6*  HCT 30.9* 31.1*  MCV 88.3 87.9  PLT 292 331   No results for input(s): "LABPROT", "INR" in the last 72 hours.    Assessment/Plan: -Complicated Crohn's disease with inflammatory distal ileal stricture as well as history of abscess.  Recent CT showed no evidence of abscess but persistent stricture with mild dilation of  distal ileum.  Recommendations ------------------------- -Continue IV Solu-Medrol for 24 to 48 hours.  Changed to oral steroid once able to tolerate liquid diet.  Recommend tapering dose of steroids on discharge with 40 mg once a day for a week followed by 30 mg for another week followed by 20 mg for another week and then 10 mg for 1 week.  -Continue IV antibiotics while in the hospital.  Recommend to switch to oral antibiotics for 2 weeks on the discharge because of recent abscess.  -Ideally, patient needs to be treated at  IBD center such as [facility] at  [place].  She is already established at [facility] with Dr. Koleen Distance.  Patient was advised to go to [facility] after discharge to continue her care for her complicated Crohn's disease.  She may eventually need surgical intervention.  -GI will sign off.  Call us back if needed.   Otis Brace MD, [place] 10/14/2022, 2:49 PM  Contact #  [phone]

## 2022-10-14 NOTE — Progress Notes (Signed)
PROGRESS NOTE  Sharon [name]  DOB: Mar 04, 1981  PCP: Patient, No Pcp Per OZH:[id number]  DOA: 10/13/2022  LOS: 0 days  Hospital Day: 2  Brief narrative: Sharon [name] is a 42 y.o. female with PMH significant for Crohn's disease complicated by multiple strictures and surgical interventions in the past who was recently hospitalized at [facility] long 1/10 -1/14 for Crohn's colitis with abscess.  She was treated with steroids and antibiotics.  She apparently left AMA on 1/14. Continue to have symptoms at home.  Also started getting lightheaded, poor oral intake and hence returned back to the ED on 1/17.  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 She follows up with Dr. Cecilie [name], GI and Dr. Morton [name], general surgery and Dr. Burton [name], pain management at [facility]. She is status post ileocecectomy 2004.  Per previous notes, her last dose of Stelara was 8 weeks ago.  She is reportedly waiting for approval to start this [place].  In the ED, patient was afebrile, hemodynamically stable, breathing on room air Labs showed potassium low at 3, BUN/creatinine normal, lipase elevated 179, WC count mildly elevated 11.6, hemoglobin 10.5 Urinalysis with hazy yellow urine with 20 ketones, negative leukocytes, negative nitrite CT abdomen pelvis showed 5-6 cm segment of distal ileum just proximal to the neo terminal ileum demonstrating circumferential wall thickening and perienteric edema/inflammation. This is immediately distal to a dilated segment consistent with inflammatory stricture. There is some fluid along the more distal inflammatory stricture, but is less focal than on the prior study and no rim enhancing or organized mesenteric abscess evident  There is also a second area of circumferential wall thickening just proximal to the dilated segment. Generally, imaging features suggest inflammatory bowel disease.  Patient  was started on IV fluid, IV Solu-Medrol, IV antibiotics, IV analgesics EDP discussed the case with [facility] attending Admitted to [facility]  Subjective: Patient was seen and examined this morning.  Pleasant young African-American female. Sitting up at the edge of the bed.  Complains of nausea and abdominal pain. Chart reviewed  Assessment and plan: Crohn's colitis History of Crohn's disease complicated by strictures S/p ileocecectomy 2004 Presented with abdominal pain nausea, vomiting, poor oral intake for a month. Recently hospitalized for Crohn's colitis but left AMA Presented with flareup of symptoms. No fever.  WBC count at 11.6. CT scan with evidence of colitis but no abscess at this time On admission, patient was restarted on IV antibiotics, IV steroids, IV fluid No fever.  WBC count better today. Informally discussed with [facility] Dr. Alessandra [name] this morning.  Recommended to follow the recommendations by Dr. Michail [name] from 1/14. Continue IV cefepime and IV Flagyl for now.  Continue IV Solu-Medrol 40 mg twice daily. Pain management with IV Dilaudid and oral hydrocodone as needed Patient wants to follow-up with GI locally postdischarge.  Ambulatory referral will be given. Recent Labs  Lab 10/08/22 0930 10/10/22 0522 10/13/22 0501 10/14/22 0324  WBC 6.5 7.5 11.6* 9.2    Orthostatic hypotension Complaint of dehydration due to poor oral intake leading to lightheadedness and near syncopal episodes In the ED, she had orthostatic drop in blood pressure  Continue IV hydration with NS at 125 mill per hour  Elevated lipase Mildly elevated lipase to 179.  Probably because of nausea.  CT abdomen did not suggest pancreatitis. Monitor lipase Recent Labs  Lab 10/08/22 0930 10/09/22 0618 10/10/22 0522 10/13/22 0501 10/14/22 0324  AST 22 13* 16 13* 14*  ALT 13 14 16 16 16   ALKPHOS 31* 35* 33* 32* 34*  BILITOT 0.6 0.5 0.5 0.8 1.0  PROT 6.1* 6.4* 6.6 6.4* 6.5  ALBUMIN 2.9* 2.9* 2.8*  3.0* 2.9*  INR  --  1.2  --   --   --   LIPASE  --   --   --  179*  --   PLT 291  --  228 292 331   Hypokalemia Potassium was low at 3.  Replacement given Recent Labs  Lab 10/08/22 0930 10/09/22 0618 10/10/22 0522 10/13/22 0501 10/13/22 0521 10/14/22 0324  K 3.8 4.3 4.1 3.0*  --  4.2  MG 2.2 2.1 2.1  --  1.7  --    Chronic anemia Hemoglobin slightly low at baseline close to 10.  Remains stable.  Continue monitor Recent Labs    10/07/22 0526 10/08/22 0930 10/09/22 0618 10/10/22 0522 10/13/22 0501 10/14/22 0324  HGB 9.7* 8.7*  --  8.9* 10.5* 10.6*  MCV 90.2 90.6  --  91.6 88.3 87.9  VITAMINB12  --   --  2,161*  --   --   --   FOLATE  --   --  5.9*  --   --   --   TIBC  --   --  221*  --   --   --   IRON  --   --  56  --   --   --     Mobility: Encourage ambulation  Goals of care   Code Status: Full Code     DVT prophylaxis:  enoxaparin (LOVENOX) injection 40 mg Start: 10/13/22 1530   Antimicrobials: IV cefepime, IV Flagyl Fluid: NS at 125 mill per hour Consultants: None Family Communication: None at bedside  Scheduled Meds:  enoxaparin (LOVENOX) injection  40 mg Subcutaneous Q24H   methylPREDNISolone (SOLU-MEDROL) injection  40 mg Intravenous Q12H   pantoprazole  40 mg Oral Daily   ramelteon  8 mg Oral QHS   sodium chloride flush  3 mL Intravenous Q12H    PRN meds: acetaminophen **OR** acetaminophen, HYDROcodone-acetaminophen, HYDROmorphone (DILAUDID) injection, polyethylene glycol, traZODone   Infusions:   sodium chloride     ceFEPime (MAXIPIME) IV     metronidazole 500 mg (10/14/22 0304)    Antimicrobials: Anti-infectives (From admission, onward)    Start     Dose/Rate Route Frequency Ordered Stop   10/14/22 1400  ceFEPIme (MAXIPIME) 2 g in sodium chloride 0.9 % 100 mL IVPB        2 g 200 mL/hr over 30 Minutes Intravenous Every 8 hours 10/14/22 1250 10/15/22 1359   10/14/22 0300  metroNIDAZOLE (FLAGYL) IVPB 500 mg        500 mg 100 mL/hr  over 60 Minutes Intravenous Every 12 hours 10/13/22 1520     10/13/22 1445  metroNIDAZOLE (FLAGYL) IVPB 500 mg        500 mg 100 mL/hr over 60 Minutes Intravenous  Once 10/13/22 1437 10/13/22 1750       Skin assessment:      Diet:  Diet Order             Diet full liquid Room service appropriate? Yes; Fluid consistency: Thin  Diet effective now                   Nutritional status:  Body mass index is 30.74 kg/m.           Status is: Observation Level of care: Telemetry Medical  Continue in-hospital care because: Needs IV medicines  Dispo: The patient is from: Home              Anticipated d/c is to: Pending clinical course              Patient currently is not medically stable to d/c.   Difficult to place patient No   Objective: Vitals:   10/14/22 0007 10/14/22 0442  BP: 104/79 131/78  Pulse: 92 (!) 50  Resp:    Temp: (!) 97.4 F (36.3 C) 97.7 F (36.5 C)  SpO2: 100% 97%    Intake/Output Summary (Last 24 hours) at 10/14/2022 1256 Last data filed at 10/14/2022 0304 Gross per 24 hour  Intake 1341.86 ml  Output --  Net 1341.86 ml   Filed Weights   10/13/22 0236  Weight: 86.4 kg   Weight change:  Body mass index is 30.74 kg/m.   Physical Exam: General exam: Pleasant, young African-American female. Skin: No rashes, lesions or ulcers. HEENT: Atraumatic, normocephalic, no obvious bleeding Lungs: Clear to auscultation bilaterally CVS: Regular rate and rhythm, no murmur GI/Abd soft, nondistended, mild diffuse generalized tenderness present, bowel sound present CNS: Alert, awake, oriented x 3 Psychiatry: Mood appropriate Extremities: No pedal edema, no calf tenderness  Data Review: I have personally reviewed the laboratory data and studies available.  F/u labs ordered Unresulted Labs (From admission, onward)     Start     Ordered   10/15/22 0500  CBC with Differential/Platelet  Tomorrow morning,   R        10/14/22 1253   10/15/22 0500   Comprehensive metabolic panel  Tomorrow morning,   R        10/14/22 1253   10/15/22 0500  Magnesium  Tomorrow morning,   R        10/14/22 1253   10/15/22 0500  Phosphorus  Tomorrow morning,   R        10/14/22 1253            Total time spent in review of labs and imaging, patient evaluation, formulation of plan, documentation and communication with family: 32 minutes  Signed, Terrilee Croak, MD [facility] 10/14/2022

## 2022-10-14 NOTE — Progress Notes (Signed)
Patient refused to do skin assessment.She requested for 4x4 gauze informed her we cannot give 4x4 gauze. She was informed that any patient that comes to hospital two nurses have to do skin assessment. She is requesting for GI doctor to do skin assessment instead. She also refused to have telemetry monitor place reporting she is allergic to electrodes. On call provider notified.

## 2022-10-14 NOTE — Progress Notes (Signed)
Consult received for PIV placement. Arrived to patient's room. Patient declined PIV at this time. Requests to shower first and for IV team to return latter. Notified nurse to re-consult IV team when patient is in bed and ready for assessment and placement of PIV. Fran Lowes, RN VAST

## 2022-10-15 DIAGNOSIS — E876 Hypokalemia: Secondary | ICD-10-CM | POA: Diagnosis present

## 2022-10-15 DIAGNOSIS — K8020 Calculus of gallbladder without cholecystitis without obstruction: Secondary | ICD-10-CM | POA: Diagnosis present

## 2022-10-15 DIAGNOSIS — Z79899 Other long term (current) drug therapy: Secondary | ICD-10-CM | POA: Diagnosis not present

## 2022-10-15 DIAGNOSIS — F32A Depression, unspecified: Secondary | ICD-10-CM | POA: Diagnosis present

## 2022-10-15 DIAGNOSIS — Z6830 Body mass index (BMI) 30.0-30.9, adult: Secondary | ICD-10-CM | POA: Diagnosis not present

## 2022-10-15 DIAGNOSIS — E860 Dehydration: Secondary | ICD-10-CM | POA: Diagnosis present

## 2022-10-15 DIAGNOSIS — K50119 Crohn's disease of large intestine with unspecified complications: Secondary | ICD-10-CM | POA: Diagnosis not present

## 2022-10-15 DIAGNOSIS — D649 Anemia, unspecified: Secondary | ICD-10-CM | POA: Diagnosis present

## 2022-10-15 DIAGNOSIS — Z888 Allergy status to other drugs, medicaments and biological substances status: Secondary | ICD-10-CM | POA: Diagnosis not present

## 2022-10-15 DIAGNOSIS — I951 Orthostatic hypotension: Secondary | ICD-10-CM | POA: Diagnosis present

## 2022-10-15 DIAGNOSIS — K5080 Crohn's disease of both small and large intestine without complications: Secondary | ICD-10-CM | POA: Diagnosis present

## 2022-10-15 DIAGNOSIS — E669 Obesity, unspecified: Secondary | ICD-10-CM | POA: Diagnosis present

## 2022-10-15 DIAGNOSIS — R748 Abnormal levels of other serum enzymes: Secondary | ICD-10-CM | POA: Diagnosis present

## 2022-10-15 LAB — CBC WITH DIFFERENTIAL/PLATELET
Abs Immature Granulocytes: 0.08 10*3/uL — ABNORMAL HIGH (ref 0.00–0.07)
Basophils Absolute: 0 10*3/uL (ref 0.0–0.1)
Basophils Relative: 0 %
Eosinophils Absolute: 0 10*3/uL (ref 0.0–0.5)
Eosinophils Relative: 0 %
HCT: 29.5 % — ABNORMAL LOW (ref 36.0–46.0)
Hemoglobin: 10 g/dL — ABNORMAL LOW (ref 12.0–15.0)
Immature Granulocytes: 1 %
Lymphocytes Relative: 9 %
Lymphs Abs: 0.7 10*3/uL (ref 0.7–4.0)
MCH: 29.8 pg (ref 26.0–34.0)
MCHC: 33.9 g/dL (ref 30.0–36.0)
MCV: 87.8 fL (ref 80.0–100.0)
Monocytes Absolute: 0.3 10*3/uL (ref 0.1–1.0)
Monocytes Relative: 4 %
Neutro Abs: 7.5 10*3/uL (ref 1.7–7.7)
Neutrophils Relative %: 86 %
Platelets: 327 10*3/uL (ref 150–400)
RBC: 3.36 MIL/uL — ABNORMAL LOW (ref 3.87–5.11)
RDW: 14.1 % (ref 11.5–15.5)
WBC: 8.6 10*3/uL (ref 4.0–10.5)
nRBC: 0 % (ref 0.0–0.2)

## 2022-10-15 LAB — COMPREHENSIVE METABOLIC PANEL
ALT: 17 U/L (ref 0–44)
AST: 14 U/L — ABNORMAL LOW (ref 15–41)
Albumin: 2.9 g/dL — ABNORMAL LOW (ref 3.5–5.0)
Alkaline Phosphatase: 27 U/L — ABNORMAL LOW (ref 38–126)
Anion gap: 10 (ref 5–15)
BUN: 7 mg/dL (ref 6–20)
CO2: 24 mmol/L (ref 22–32)
Calcium: 8.8 mg/dL — ABNORMAL LOW (ref 8.9–10.3)
Chloride: 106 mmol/L (ref 98–111)
Creatinine, Ser: 0.87 mg/dL (ref 0.44–1.00)
GFR, Estimated: >60 mL/min (ref >60)
Glucose, Bld: 111 mg/dL — ABNORMAL HIGH (ref 70–99)
Potassium: 3.7 mmol/L (ref 3.5–5.1)
Sodium: 140 mmol/L (ref 135–145)
Total Bilirubin: 0.7 mg/dL (ref 0.3–1.2)
Total Protein: 6.4 g/dL — ABNORMAL LOW (ref 6.5–8.1)

## 2022-10-15 LAB — LIPASE, BLOOD: Lipase: 82 U/L — ABNORMAL HIGH (ref 11–51)

## 2022-10-15 LAB — PHOSPHORUS: Phosphorus: 2.8 mg/dL (ref 2.5–4.6)

## 2022-10-15 LAB — MAGNESIUM: Magnesium: 1.9 mg/dL (ref 1.7–2.4)

## 2022-10-15 MED ORDER — KATE FARMS STANDARD 1.4 PO LIQD
325.0000 mL | Freq: Two times a day (BID) | ORAL | Status: DC
  Administered 2022-10-15 – 2022-10-19 (×4): 325 mL via ORAL
  Filled 2022-10-15 (×10): qty 325

## 2022-10-15 MED ORDER — FOLIC ACID 1 MG PO TABS
1.0000 mg | ORAL_TABLET | Freq: Every day | ORAL | Status: DC
  Administered 2022-10-15 – 2022-10-19 (×5): 1 mg via ORAL
  Filled 2022-10-15 (×5): qty 1

## 2022-10-15 MED ORDER — HYDROMORPHONE HCL 1 MG/ML IJ SOLN
0.5000 mg | Freq: Four times a day (QID) | INTRAMUSCULAR | Status: DC | PRN
  Administered 2022-10-15 – 2022-10-16 (×5): 0.5 mg via INTRAVENOUS
  Filled 2022-10-15 (×5): qty 0.5

## 2022-10-15 MED ORDER — ONDANSETRON HCL 4 MG/2ML IJ SOLN
4.0000 mg | Freq: Four times a day (QID) | INTRAMUSCULAR | Status: DC | PRN
  Administered 2022-10-15 – 2022-10-17 (×3): 4 mg via INTRAVENOUS
  Filled 2022-10-15 (×3): qty 2

## 2022-10-15 NOTE — Progress Notes (Signed)
PROGRESS NOTE  Sharon [name]  DOB: 15-Dec-1980  PCP: Patient, No Pcp Per LFY:[id number]  DOA: 10/13/2022  LOS: 0 days  Hospital Day: 3  Brief narrative: Sharon [name] is a 42 y.o. female with PMH significant for Crohn's disease complicated by multiple strictures and surgical interventions in the past who was recently hospitalized at [facility] long 1/10 -1/14 for Crohn's colitis with abscess.  She was treated with steroids and antibiotics.  She apparently left AMA on 1/14. Continue to have symptoms at home.  Also started getting lightheaded, poor oral intake and hence returned back to the ED on 1/17.  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 She follows up with Dr. Cecilie Lowers, GI and Dr. Morton Stall, general surgery and Dr. Burton Apley, pain management at [facility]. She is status post ileocecectomy 2004.  Per previous notes, her last dose of Stelara was 8 weeks ago.  She is reportedly waiting for approval to start this [place].  In the ED, patient was afebrile, hemodynamically stable, breathing on room air Labs showed potassium low at 3, BUN/creatinine normal, lipase elevated 179, WC count mildly elevated 11.6, hemoglobin 10.5 Urinalysis with hazy yellow urine with 20 ketones, negative leukocytes, negative nitrite CT abdomen pelvis showed 5-6 cm segment of distal ileum just proximal to the neo terminal ileum demonstrating circumferential wall thickening and perienteric edema/inflammation. This is immediately distal to a dilated segment consistent with inflammatory stricture. There is some fluid along the more distal inflammatory stricture, but is less focal than on the prior study and no rim enhancing or organized mesenteric abscess evident  There is also a second area of circumferential wall thickening just proximal to the dilated segment. Generally, imaging features suggest inflammatory bowel disease.  Patient  was started on IV fluid, IV Solu-Medrol, IV antibiotics, IV analgesics Admitted to [facility] consult was obtained.  Subjective: Patient was seen and examined this morning.  Pleasant young African-American female. Feels better but continues to have episodes of abdominal pain.  Tolerating full liquid diet and is afraid to advance. Husband at bedside.  Assessment and plan: Crohn's colitis History of Crohn's disease complicated by strictures S/p ileocecectomy 2004 Presented with abdominal pain nausea, vomiting, poor oral intake for a month. Recently hospitalized for Crohn's colitis but left AMA.  She states 'I was confused and made wrong decision.' Presented with persistent flareup of symptoms. No fever.  WBC count at 11.6. CT scan with evidence of colitis but no abscess at this time On admission, patient was restarted on IV antibiotics, IV steroids, IV fluid No fever.  WBC count improved. GI consult proceeded.  Currently on IV cefepime and IV Flagyl for now.  Continue IV Solu-Medrol 40 mg twice daily.  Will plan to continue IV treatment for today, switch to oral tomorrow and possibly discharge. Patient does not want advance diet beyond full liquid today.  She wants to speak to a nutritionist. Continue oral hydrocodone as needed.  Encouraged to minimize use of IV Dilaudid. Follows up with GI and surgery at [place]  Lab 10/10/22 0522 10/13/22 0501 10/14/22 0324 10/15/22 0337  WBC 7.5 11.6* 9.2 8.6    Orthostatic hypotension Complaint of dehydration due to poor oral intake leading to lightheadedness and near syncopal episodes In the ED, she had orthostatic drop in blood pressure  Continues to complain of dizziness on walking to the bathroom.  Repeat orthostatic vital signs today Continue IV hydration with NS at 125 mill per  hour  Elevated lipase Mildly elevated lipase to 179.  Probably because of nausea.  CT abdomen did not suggest pancreatitis. Repeat lipase today  lipase Recent Labs  Lab 10/09/22 0618 10/10/22 0522 10/13/22 0501 10/14/22 0324 10/15/22 0337  AST 13* 16 13* 14* 14*  ALT 14 16 16 16 17   ALKPHOS 35* 33* 32* 34* 27*  BILITOT 0.5 0.5 0.8 1.0 0.7  PROT 6.4* 6.6 6.4* 6.5 6.4*  ALBUMIN 2.9* 2.8* 3.0* 2.9* 2.9*  INR 1.2  --   --   --   --   LIPASE  --   --  179*  --   --   PLT  --  228 292 331 327   Hypokalemia Potassium was low at 3.  Replacement given Recent Labs  Lab 10/09/22 0618 10/10/22 0522 10/13/22 0501 10/13/22 0521 10/14/22 0324 10/15/22 0337  K 4.3 4.1 3.0*  --  4.2 3.7  MG 2.1 2.1  --  1.7  --  1.9  PHOS  --   --   --   --   --  2.8   Chronic anemia Hemoglobin slightly low at baseline close to 10.  Remains stable.  Continue monitor Recent Labs    10/08/22 0930 10/09/22 0618 10/10/22 0522 10/13/22 0501 10/14/22 0324 10/15/22 0337  HGB 8.7*  --  8.9* 10.5* 10.6* 10.0*  MCV 90.6  --  91.6 88.3 87.9 87.8  VITAMINB12  --  2,161*  --   --   --   --   FOLATE  --  5.9*  --   --   --   --   TIBC  --  221*  --   --   --   --   IRON  --  56  --   --   --   --     Mobility: Encourage ambulation  Goals of care   Code Status: Full Code     DVT prophylaxis:  enoxaparin (LOVENOX) injection 40 mg Start: 10/13/22 1530   Antimicrobials: IV cefepime, IV Flagyl Fluid: NS at 125 mill per hour Consultants: None Family Communication: Husband at bedside  Scheduled Meds:  enoxaparin (LOVENOX) injection  40 mg Subcutaneous Q24H   methylPREDNISolone (SOLU-MEDROL) injection  40 mg Intravenous Q12H   pantoprazole  40 mg Oral Daily   sodium chloride flush  3 mL Intravenous Q12H    PRN meds: acetaminophen **OR** acetaminophen, HYDROcodone-acetaminophen, HYDROmorphone (DILAUDID) injection, polyethylene glycol   Infusions:   sodium chloride     metronidazole 500 mg (10/15/22 0247)    Antimicrobials: Anti-infectives (From admission, onward)    Start     Dose/Rate Route Frequency Ordered Stop   10/14/22 1400   ceFEPIme (MAXIPIME) 2 g in sodium chloride 0.9 % 100 mL IVPB        2 g 200 mL/hr over 30 Minutes Intravenous Every 8 hours 10/14/22 1250 10/15/22 0632   10/14/22 0300  metroNIDAZOLE (FLAGYL) IVPB 500 mg        500 mg 100 mL/hr over 60 Minutes Intravenous Every 12 hours 10/13/22 1520     10/13/22 1445  metroNIDAZOLE (FLAGYL) IVPB 500 mg        500 mg 100 mL/hr over 60 Minutes Intravenous  Once 10/13/22 1437 10/13/22 1750       Skin assessment:      Diet:  Diet Order             Diet full liquid Room service appropriate? Yes; Fluid consistency: Thin  Diet effective now                   Nutritional status:  Body mass index is 30.74 kg/m.           Status is: Observation Level of care: Telemetry Medical  Continue in-hospital care because: Continue IV medicines  Dispo: The patient is from: Home              Anticipated d/c is to: Pending clinical course.  Hopefully home in 1 to 2 days              Patient currently is not medically stable to d/c.   Difficult to place patient No   Objective: Vitals:   10/15/22 0400 10/15/22 0950  BP: 130/85 (!) 124/99  Pulse: (!) 59 65  Resp: 18 16  Temp: 97.9 F (36.6 C) 98 F (36.7 C)  SpO2: 98% 99%   No intake or output data in the 24 hours ending 10/15/22 1108  Filed Weights   10/13/22 0236  Weight: 86.4 kg   Weight change:  Body mass index is 30.74 kg/m.   Physical Exam: General exam: Pleasant, young African-American female. Skin: No rashes, lesions or ulcers. HEENT: Atraumatic, normocephalic, no obvious bleeding Lungs: Clear to auscultation bilaterally CVS: Regular rate and rhythm, no murmur GI/Abd soft, nondistended, improving mild generalized tenderness, bowel sound present CNS: Alert, awake, oriented x 3 Psychiatry: Sad affect Extremities: No pedal edema, no calf tenderness  Data Review: I have personally reviewed the laboratory data and studies available.  F/u labs ordered Unresulted Labs  (From admission, onward)     Start     Ordered   10/15/22 1108  Lipase, blood  Add-on,   AD        10/15/22 1107            Total time spent in review of labs and imaging, patient evaluation, formulation of plan, documentation and communication with family: 45 minutes  Signed, Lorin Glass, MD [facility] 10/15/2022

## 2022-10-15 NOTE — Progress Notes (Signed)
Initial Nutrition Assessment  DOCUMENTATION CODES:   Not applicable  INTERVENTION:   Multivitamin w/ minerals daily Advance diet to Dysphagia 1 [employer] 1.4 PO BID, each supplement provides 455 kcal and 20 grams protein. Start 1 mg folic acid supplement daily due to deficiency  NUTRITION DIAGNOSIS:   Inadequate oral intake related to poor appetite as evidenced by per patient/family report.  GOAL:   Patient will meet greater than or equal to 90% of their needs  MONITOR:   PO intake, Supplement acceptance, Labs, Diet advancement, Weight trends  REASON FOR ASSESSMENT:   Consult Assessment of nutrition requirement/status, Diet education  ASSESSMENT:   42 y.o. female presented to the ED with abdominal pain and vomiting. Pt recently admitted to [facility] 1/10-1/14 with Crohn's colitis and abscess, diet was advanced and left AMA. PMH includes Grave's disease and Crohn's disease. Pt admitted again with Crohn's colitis, hypokalemia, and near syncope episodes.   1/17 - admitted 1/18 - diet advanced to full liquids  Met with pt, pt laying in bed. Reports that she has not really tried any of the full liquids due to intolerance to the foods. Pt provided a list of foods that she does not tolerate due to her Crohn's. Shares that over the past month she has ate little, would try a very small meal and would end up getting sick. She would have gastric pain and nausea, shared that she does not feel ready to eat. Shares that she goes to the bathroom >10 times per day.  Pt reports about a 20# weight loss recently. Per Care Everywhere, pt was 99.3 kg in August when at [facility]; this would be a 13% weight loss within 5 months and is clinically significant for time frame.  Discussed the use of oral nutrition supplements for pt to sip on to provide additional calories and protein. Pt shares that she cannot have the Ensure or Boost Breeze due to the whey isolate in them. Pt was agreeable to try the [place]. Also discussed advancing diet to solid foods to provide more options due to her multiple intolerances; pt willing to try pureed diet due to stricture in GI tract.   Discussed with RN to let RD know how pt tolerates the [employer].   Discussed with MD. Madaline Brilliant to advance diet. Discussed starting folic acid supplement due to deficiency; MD agreeable.   Medications reviewed and include: Solu-Medrol, Protonix, IV antibiotics Labs reviewed: Sodium 140, Potassium 3.7, Phosphorus 2.8, Magnesium 1.9, Folate 5.9 (L), CRP 3.9 (H), Vitamin B12 2161 (H)   NUTRITION - FOCUSED PHYSICAL EXAM:  Deferred to follow-up.   Diet Order:   Diet Order             DIET - DYS 1 Room service appropriate? Yes; Fluid consistency: Thin  Diet effective now                   EDUCATION NEEDS:   Education needs have been addressed  Skin:  Skin Assessment: Reviewed RN Assessment  Last BM:  1/17  Height:   Ht Readings from Last 1 Encounters:  10/13/22 5\' 6"  (1.676 m)    Weight:   Wt Readings from Last 1 Encounters:  10/13/22 86.4 kg    Ideal Body Weight:  59.1 kg  BMI:  Body mass index is 30.74 kg/m.  Estimated Nutritional Needs:  Kcal:  2000-2200 Protein:  100-115 grams Fluid:  >/= 2 L   Hermina Barters RD, LDN Clinical Dietitian See Shea Evans  for contact information.

## 2022-10-15 NOTE — Progress Notes (Signed)
   10/14/22 2203  Provider Notification  Provider Name/Title Miles Costain  Date Provider Notified 10/14/22  Time Provider Notified 2204  Method of Notification Page ([place])  Notification Reason Other (Comment) (Patient refused to be on telemetry,stated she gets skin issues ,patient educated)  Provider response Other (Comment) (document refusal and attempt later tonight,can trial leads on her back if she prefers)  Date of Provider Response 10/14/22  Time of Provider Response 2206

## 2022-10-16 DIAGNOSIS — K50119 Crohn's disease of large intestine with unspecified complications: Secondary | ICD-10-CM | POA: Diagnosis not present

## 2022-10-16 MED ORDER — PROMETHAZINE HCL 25 MG PO TABS
12.5000 mg | ORAL_TABLET | Freq: Four times a day (QID) | ORAL | Status: DC | PRN
  Filled 2022-10-16: qty 1

## 2022-10-16 MED ORDER — PREDNISONE 20 MG PO TABS
40.0000 mg | ORAL_TABLET | Freq: Every day | ORAL | Status: DC
  Administered 2022-10-17 – 2022-10-19 (×3): 40 mg via ORAL
  Filled 2022-10-16 (×4): qty 2

## 2022-10-16 NOTE — Progress Notes (Signed)
   10/15/22 1947  Assess: MEWS Score  Temp 99.2 F (37.3 C)  BP 105/68  MAP (mmHg) 80  Pulse Rate (!) 127  Resp 20  SpO2 97 %  O2 Device Room Air  Assess: MEWS Score  MEWS Temp 0  MEWS Systolic 0  MEWS Pulse 2  MEWS RR 0  MEWS LOC 0  MEWS Score 2  MEWS Score Color Yellow  Assess: if the MEWS score is Yellow or Red  Were vital signs taken at a resting state? No  Focused Assessment No change from prior assessment  Does the patient meet 2 or more of the SIRS criteria? No  MEWS guidelines implemented *See Row Information* No, other (Comment)  Treat  MEWS Interventions Escalated (See documentation below)  Pain Scale 0-10  Take Vital Signs  Increase Vital Sign Frequency  Yellow: Q 2hr X 2 then Q 4hr X 2, if remains yellow, continue Q 4hrs  Escalate  MEWS: Escalate Yellow: discuss with charge nurse/RN and consider discussing with provider and RRT  Notify: Charge Nurse/RN  Name of Charge Nurse/RN Notified Dauda ,RN  Date Charge Nurse/RN Notified 10/15/22  Time Charge Nurse/RN Notified 2144  Provider Notification  Provider Name/Title Abigail  Date Provider Notified 10/15/22  Time Provider Notified 2046  Method of Notification Page (secure chat)  Notification Reason Other (Comment) (Yellow MEWS)  Provider response Other (Comment) (Continue IV hydration and assist patient when mobilizing)  Date of Provider Response 10/15/22  Time of Provider Response 2118  Assess: SIRS CRITERIA  SIRS Temperature  0  SIRS Pulse 1  SIRS Respirations  0  SIRS WBC 0  SIRS Score Sum  1

## 2022-10-16 NOTE — Progress Notes (Signed)
PROGRESS NOTE    Sharon [name]  QIO:[id number] DOB: 03-05-1981 DOA: 10/13/2022 PCP: Patient, No Pcp Per   Brief Narrative:  Sharon [name] is a 42 y.o. female with PMH significant for Crohn's disease complicated by multiple strictures and surgical interventions in the past who was recently hospitalized at [facility] long 1/10 -1/14 for Crohn's colitis with abscess.  She was treated with steroids and antibiotics.  She apparently left AMA on 1/14. Continue to have symptoms at home.  Also started getting lightheaded, poor oral intake and hence returned back to the ED on 1/17.   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 She follows up with Dr. Sharmon Revere, GI and Dr. Byrd Hesselbach, general surgery and Dr. Hetty Ely, pain management at [facility]. She is status post ileocecectomy 2004.  Per previous notes, her last dose of Stelara was 8 weeks ago.  She is reportedly waiting for approval to start this [place].   In the ED, patient was afebrile, hemodynamically stable, breathing on room air Labs showed potassium low at 3, BUN/creatinine normal, lipase elevated 179, WC count mildly elevated 11.6, hemoglobin 10.5 Urinalysis with hazy yellow urine with 20 ketones, negative leukocytes, negative nitrite CT abdomen pelvis showed 5-6 cm segment of distal ileum just proximal to the neo terminal ileum demonstrating circumferential wall thickening and perienteric edema/inflammation. This is immediately distal to a dilated segment consistent with inflammatory stricture. There is some fluid along the more distal inflammatory stricture, but is less focal than on the prior study and no rim enhancing or organized mesenteric abscess evident  There is also a second area of circumferential wall thickening just proximal to the dilated segment. Generally, imaging features suggest inflammatory bowel disease.   Patient was started on IV fluid, IV  Solu-Medrol, IV antibiotics, IV analgesics Admitted to [facility] GI consult was obtained.  Assessment & Plan:   Crohn's colitis History of Crohn's disease complicated by strictures S/p ileocecectomy 2004 -Presented with abdominal pain nausea, vomiting, poor oral intake for a month. -Recently hospitalized for Crohn's colitis but left AMA.  No fever.  WBC count at 11.6. -CT scan with evidence of colitis but no abscess. -On admission, patient was restarted on IV antibiotics, IV steroids, IV fluid -Currently on Flagyl.   -Will switch IV Solu-Medrol to prednisone 40 mg daily.  Advance diet to soft diet today.  Decrease IV fluid rate -GI signed off.  Patient requested to see [facility] GI outpatient for second opinion.   Orthostatic hypotension -Patient is improving.  Continue gentle hydration.  Elevated lipase -Mildly elevated lipase to 179 trending down to 82.  CT abdomen did not show pancreatitis  Hypokalemia -Resolved  Chronic anemia -Hemoglobin slightly low at baseline close to 10.  Remains stable.  Continue monitor  Obesity with BMI of 30: -Patient will get benefit from diet modification, exercise   DVT prophylaxis: Lovenox Code Status: Full code Family Communication:  None present at bedside.  Plan of care discussed with patient in length and she verbalized understanding and agreed with it. Disposition Plan: Home  Consultants:  GI  Procedures:  None  Antimicrobials:  Cefepime Flagyl  Status is: Inpatient    Subjective: Patient seen and examined.  Sitting comfortably on the bed.  Overall she feels better.  Continues to have some nausea.  Requested to advance diet to soft diet today.  Requested to see [facility] GI outpatient for second opinion.  Concerned about orthostatic hypotension.  Objective: Vitals:   10/15/22  2214 10/16/22 0134 10/16/22 0402 10/16/22 0752  BP: 106/78 107/81 138/88 (!) 148/94  Pulse: 94 73 (!) 53 (!) 52  Resp: 18 18 18 17   Temp: 98.8 F (37.1 C)  98.5 F (36.9 C) (!) 97.3 F (36.3 C) 99 F (37.2 C)  TempSrc: Oral Oral Oral Oral  SpO2: 97% 96% 96% 99%  Weight:      Height:        Intake/Output Summary (Last 24 hours) at 10/16/2022 1200 Last data filed at 10/16/2022 0900 Gross per 24 hour  Intake 720 ml  Output --  Net 720 ml   Filed Weights   10/13/22 0236  Weight: 86.4 kg    Examination:  General exam: Appears calm and comfortable, on room air, communicating well, very pleasant Respiratory system: Clear to auscultation. Respiratory effort normal. Cardiovascular system: S1 & S2 heard, RRR. No JVD, murmurs, rubs, gallops or clicks. No pedal edema. Gastrointestinal system: Abdomen is nondistended, soft and nontender. No organomegaly or masses felt. Normal bowel sounds heard. Central nervous system: Alert and oriented. No focal neurological deficits. Extremities: Symmetric 5 x 5 power. Skin: No rashes, lesions or ulcers Psychiatry: Judgement and insight appear normal. Mood & affect appropriate.    Data Reviewed: I have personally reviewed following labs and imaging studies  CBC: Recent Labs  Lab 10/10/22 0522 10/13/22 0501 10/14/22 0324 10/15/22 0337  WBC 7.5 11.6* 9.2 8.6  NEUTROABS 5.8 9.5*  --  7.5  HGB 8.9* 10.5* 10.6* 10.0*  HCT 27.1* 30.9* 31.1* 29.5*  MCV 91.6 88.3 87.9 87.8  PLT 228 292 331 644   Basic Metabolic Panel: Recent Labs  Lab 10/10/22 0522 10/13/22 0501 10/13/22 0521 10/14/22 0324 10/15/22 0337  NA 140 138  --  136 140  K 4.1 3.0*  --  4.2 3.7  CL 106 101  --  104 106  CO2 25 25  --  22 24  GLUCOSE 120* 90  --  139* 111*  BUN 11 <5*  --  8 7  CREATININE 1.04* 1.14*  --  1.01* 0.87  CALCIUM 8.6* 8.5*  --  8.5* 8.8*  MG 2.1  --  1.7  --  1.9  PHOS  --   --   --   --  2.8   GFR: Estimated Creatinine Clearance: 95.1 mL/min (by C-G formula based on SCr of 0.87 mg/dL). Liver Function Tests: Recent Labs  Lab 10/10/22 0522 10/13/22 0501 10/14/22 0324 10/15/22 0337  AST 16 13*  14* 14*  ALT 16 16 16 17   ALKPHOS 33* 32* 34* 27*  BILITOT 0.5 0.8 1.0 0.7  PROT 6.6 6.4* 6.5 6.4*  ALBUMIN 2.8* 3.0* 2.9* 2.9*   Recent Labs  Lab 10/13/22 0501 10/15/22 1129  LIPASE 179* 82*   No results for input(s): "AMMONIA" in the last 168 hours. Coagulation Profile: No results for input(s): "INR", "PROTIME" in the last 168 hours. Cardiac Enzymes: No results for input(s): "CKTOTAL", "CKMB", "CKMBINDEX", "TROPONINI" in the last 168 hours. BNP (last 3 results) No results for input(s): "PROBNP" in the last 8760 hours. HbA1C: No results for input(s): "HGBA1C" in the last 72 hours. CBG: No results for input(s): "GLUCAP" in the last 168 hours. Lipid Profile: No results for input(s): "CHOL", "HDL", "LDLCALC", "TRIG", "CHOLHDL", "LDLDIRECT" in the last 72 hours. Thyroid Function Tests: No results for input(s): "TSH", "T4TOTAL", "FREET4", "T3FREE", "THYROIDAB" in the last 72 hours. Anemia Panel: No results for input(s): "VITAMINB12", "FOLATE", "FERRITIN", "TIBC", "IRON", "RETICCTPCT" in the last 72  hours. Sepsis Labs: No results for input(s): "PROCALCITON", "LATICACIDVEN" in the last 168 hours.  Recent Results (from the past 240 hour(s))  Resp panel by RT-PCR (RSV, Flu A&B, Covid) Anterior Nasal Swab     Status: None   Collection Time: 10/06/22  2:10 PM   Specimen: Anterior Nasal Swab  Result Value Ref Range Status   SARS Coronavirus 2 by RT PCR NEGATIVE NEGATIVE Final    Comment: (NOTE) SARS-CoV-2 target nucleic acids are NOT DETECTED.  The SARS-CoV-2 RNA is generally detectable in upper respiratory specimens during the acute phase of infection. The lowest concentration of SARS-CoV-2 viral copies this assay can detect is 138 copies/mL. A negative result does not preclude SARS-Cov-2 infection and should not be used as the sole basis for treatment or other patient management decisions. A negative result may occur with  improper specimen collection/handling, submission of  specimen other than nasopharyngeal swab, presence of viral mutation(s) within the areas targeted by this assay, and inadequate number of viral copies(<138 copies/mL). A negative result must be combined with clinical observations, patient history, and epidemiological information. The expected result is Negative.  Fact Sheet for Patients:  BloggerCourse.com  Fact Sheet for Healthcare Providers:  SeriousBroker.it  This test is no t yet approved or cleared by the [place] FDA and  has been authorized for detection and/or diagnosis of SARS-CoV-2 by FDA under an Emergency Use Authorization (EUA). This EUA will remain  in effect (meaning this test can be used) for the duration of the COVID-19 declaration under Section 564(b)(1) of the Act, 21 U.S.C.section 360bbb-3(b)(1), unless the authorization is terminated  or revoked sooner.       Influenza A by PCR NEGATIVE NEGATIVE Final   Influenza B by PCR NEGATIVE NEGATIVE Final    Comment: (NOTE) The Xpert Xpress SARS-CoV-2/FLU/RSV plus assay is intended as an aid in the diagnosis of influenza from Nasopharyngeal swab specimens and should not be used as a sole basis for treatment. Nasal washings and aspirates are unacceptable for Xpert Xpress SARS-CoV-2/FLU/RSV testing.  Fact Sheet for Patients: BloggerCourse.com  Fact Sheet for Healthcare Providers: SeriousBroker.it  This test is not yet approved or cleared by the [place] FDA and has been authorized for detection and/or diagnosis of SARS-CoV-2 by FDA under an Emergency Use Authorization (EUA). This EUA will remain in effect (meaning this test can be used) for the duration of the COVID-19 declaration under Section 564(b)(1) of the Act, 21 U.S.C. section 360bbb-3(b)(1), unless the authorization is terminated or revoked.     Resp Syncytial Virus by PCR NEGATIVE NEGATIVE Final     Comment: (NOTE) Fact Sheet for Patients: BloggerCourse.com  Fact Sheet for Healthcare Providers: SeriousBroker.it  This test is not yet approved or cleared by the [place] FDA and has been authorized for detection and/or diagnosis of SARS-CoV-2 by FDA under an Emergency Use Authorization (EUA). This EUA will remain in effect (meaning this test can be used) for the duration of the COVID-19 declaration under Section 564(b)(1) of the Act, 21 U.S.C. section 360bbb-3(b)(1), unless the authorization is terminated or revoked.  Performed at [facility], [address] [place], [place] [address]   Culture, blood (Routine X 2) w Reflex to ID Panel     Status: None   Collection Time: 10/07/22 10:41 AM   Specimen: BLOOD  Result Value Ref Range Status   Specimen Description   Final    BLOOD BLOOD RIGHT ARM Performed at [facility], [address] [place], [place] [address]    Special Requests   Final    BOTTLES DRAWN AEROBIC ONLY Blood Culture adequate volume Performed at [place] [address], [place], [place] [address]    Culture   Final    NO GROWTH 5 DAYS Performed at [facility], [place] [address], [place], [place] [address]    Report Status 10/12/2022 FINAL  Final  Culture, blood (Routine X 2) w Reflex to ID Panel     Status: None   Collection Time: 10/07/22 10:41 AM   Specimen: BLOOD  Result Value Ref Range Status   Specimen Description   Final    BLOOD BLOOD RIGHT FOREARM Performed at [place] [address] [place], [place] [address]    Special Requests   Final    BOTTLES DRAWN AEROBIC ONLY Blood Culture adequate volume Performed at [place] [address], [place], [place] [address]    Culture   Final    NO GROWTH 5 DAYS Performed at [facility], [place] [address], [place], [place]  [address]    Report Status 10/12/2022 FINAL  Final      Radiology Studies: No results found.  Scheduled Meds:  enoxaparin (LOVENOX) injection  40 mg Subcutaneous Q24H   feeding supplement (KATE FARMS STANDARD 1.4)  325 mL Oral BID BM   folic acid  1 mg Oral Daily   pantoprazole  40 mg Oral Daily   [START ON 10/17/2022] predniSONE  40 mg Oral Q breakfast   sodium chloride flush  3 mL Intravenous Q12H   Continuous Infusions:  sodium chloride 75 mL/hr at 10/16/22 0840   metronidazole 500 mg (10/16/22 0342)     LOS: 1 day   Time spent: 35 minutes    Algie Cales Loann Quill, MD [facility]  If 7PM-7AM, please contact night-coverage [email or web address] 10/16/2022, 12:00 PM

## 2022-10-16 NOTE — Progress Notes (Signed)
Patient refusing lovenox injection. MD Pahwani made aware.

## 2022-10-16 NOTE — Progress Notes (Signed)
Patient  has a yellow MEWS due to her pulse from 56 lying,87 sitting and 127 standing for orthostatic vital signs,on call provider notified.

## 2022-10-17 DIAGNOSIS — K50119 Crohn's disease of large intestine with unspecified complications: Secondary | ICD-10-CM | POA: Diagnosis not present

## 2022-10-17 LAB — CBC
HCT: 27.6 % — ABNORMAL LOW (ref 36.0–46.0)
Hemoglobin: 9.3 g/dL — ABNORMAL LOW (ref 12.0–15.0)
MCH: 29.8 pg (ref 26.0–34.0)
MCHC: 33.7 g/dL (ref 30.0–36.0)
MCV: 88.5 fL (ref 80.0–100.0)
Platelets: 238 10*3/uL (ref 150–400)
RBC: 3.12 MIL/uL — ABNORMAL LOW (ref 3.87–5.11)
RDW: 14.2 % (ref 11.5–15.5)
WBC: 7.2 10*3/uL (ref 4.0–10.5)
nRBC: 0 % (ref 0.0–0.2)

## 2022-10-17 LAB — BASIC METABOLIC PANEL
Anion gap: 8 (ref 5–15)
BUN: <5 mg/dL — ABNORMAL LOW (ref 6–20)
CO2: 22 mmol/L (ref 22–32)
Calcium: 8.2 mg/dL — ABNORMAL LOW (ref 8.9–10.3)
Chloride: 108 mmol/L (ref 98–111)
Creatinine, Ser: 0.89 mg/dL (ref 0.44–1.00)
GFR, Estimated: >60 mL/min (ref >60)
Glucose, Bld: 90 mg/dL (ref 70–99)
Potassium: 2.8 mmol/L — ABNORMAL LOW (ref 3.5–5.1)
Sodium: 138 mmol/L (ref 135–145)

## 2022-10-17 LAB — MAGNESIUM: Magnesium: 1.8 mg/dL (ref 1.7–2.4)

## 2022-10-17 MED ORDER — SODIUM CHLORIDE 0.9 % IV SOLN
2.0000 g | Freq: Three times a day (TID) | INTRAVENOUS | Status: DC
  Administered 2022-10-17 – 2022-10-19 (×6): 2 g via INTRAVENOUS
  Filled 2022-10-17 (×6): qty 12.5

## 2022-10-17 MED ORDER — POTASSIUM CHLORIDE 20 MEQ PO PACK
40.0000 meq | PACK | Freq: Two times a day (BID) | ORAL | Status: AC
  Administered 2022-10-17 (×2): 40 meq via ORAL
  Filled 2022-10-17 (×2): qty 2

## 2022-10-17 NOTE — Progress Notes (Signed)
PROGRESS NOTE    Sharon [name]  [id number] DOB: 1981/05/08 DOA: 10/13/2022 PCP: Patient, No Pcp Per   Brief Narrative:  Sharon [name] is a 42 y.o. female with PMH significant for Crohn's disease complicated by multiple strictures and surgical interventions in the past who was recently hospitalized at [facility] long 1/10 -1/14 for Crohn's colitis with abscess.  She was treated with steroids and antibiotics.  She apparently left AMA on 1/14. Continue to have symptoms at home.  Also started getting lightheaded, poor oral intake and hence returned back to the ED on 1/17.   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 She follows up with Dr. Cecilie Lowers, GI and Dr. Morton Stall, general surgery and Dr. Burton Apley, pain management at [facility]. She is status post ileocecectomy 2004.  Per previous notes, her last dose of Stelara was 8 weeks ago.  She is reportedly waiting for approval to start this [place].   In the ED, patient was afebrile, hemodynamically stable, breathing on room air Labs showed potassium low at 3, BUN/creatinine normal, lipase elevated 179, WC count mildly elevated 11.6, hemoglobin 10.5 Urinalysis with hazy yellow urine with 20 ketones, negative leukocytes, negative nitrite CT abdomen pelvis showed 5-6 cm segment of distal ileum just proximal to the neo terminal ileum demonstrating circumferential wall thickening and perienteric edema/inflammation. This is immediately distal to a dilated segment consistent with inflammatory stricture. There is some fluid along the more distal inflammatory stricture, but is less focal than on the prior study and no rim enhancing or organized mesenteric abscess evident  There is also a second area of circumferential wall thickening just proximal to the dilated segment. Generally, imaging features suggest inflammatory bowel disease.   Patient was started on IV fluid, IV  Solu-Medrol, IV antibiotics, IV analgesics Admitted to [facility] GI consult was obtained.  Assessment & Plan:   Crohn's colitis History of Crohn's disease complicated by strictures S/p ileocecectomy 2004 -CT scan with evidence of colitis but no abscess. -On admission, patient was restarted on IV antibiotics, IV steroids, IV fluid.  GI consulted -She received cefepime on 1/18  (3 doses only) -currently on Flagyl.  Start back on cefepime 1/21.   -1/20: Switched IV Solu-Medrol to prednisone 40 mg daily.   -Unable to tolerate GI soft diet-back to clear liquid.  Continue IV fluids -GI recommended tapering dose of steroids on discharge with 40 mg once a day for a week followed by 30 mg for another week followed by 20 mg for another week and then 10 mg for 1 week.  Continue IV antibiotics while in the hospital recommend to switch to oral antibiotics for 2 weeks on the discharge because of recent abscess. -Patient requested to see [facility] GI outpatient for second opinion and requested to speak to a dietician again   Orthostatic hypotension -Patient is improving.  Continue gentle hydration. -Check orthostatic again today -Consult PT recommended no PT follow-up  Elevated lipase -Mildly elevated lipase to 179 trending down to 82.  CT abdomen did not show pancreatitis  Hypokalemia -Replenished.  Magnesium: WNL.  Repeat BMP tomorrow a.m.  Chronic anemia -Hemoglobin slightly low at baseline close to 10.  Remains stable.  Continue monitor  Obesity with BMI of 30: -Patient will get benefit from diet modification, exercise   DVT prophylaxis: Lovenox Code Status: Full code Family Communication:  None present at bedside.  Plan of care discussed with patient in length and she verbalized understanding and agreed  with it. Disposition Plan: Home  Consultants:  GI  Procedures:  None  Antimicrobials:  Cefepime Flagyl  Status is: Inpatient    Subjective: Patient reports that she continues to have  nausea and unable to tolerate soft diet yesterday.  Has abdominal discomfort.  Reports dizziness when she stands up and concerned about her orthostatic hypotension.  Remained afebrile.  No acute events overnight.  Objective: Vitals:   10/16/22 2130 10/17/22 0543 10/17/22 0705 10/17/22 0754  BP: 115/81 104/74 113/65 104/65  Pulse: (!) 108 95 (!) 59 62  Resp: 18 18 18 17   Temp: 97.9 F (36.6 C) 98.9 F (37.2 C) 98.7 F (37.1 C) 98.7 F (37.1 C)  TempSrc: Oral Oral Oral Oral  SpO2: 98%  95% 94%  Weight:      Height:        Intake/Output Summary (Last 24 hours) at 10/17/2022 1302 Last data filed at 10/17/2022 0900 Gross per 24 hour  Intake 2603.77 ml  Output --  Net 2603.77 ml    Filed Weights   10/13/22 0236  Weight: 86.4 kg    Examination:  General exam: Appears calm and comfortable, on room air, communicating well, very pleasant Respiratory system: Clear to auscultation. Respiratory effort normal. Cardiovascular system: S1 & S2 heard, RRR. No JVD, murmurs, rubs, gallops or clicks. No pedal edema. Gastrointestinal system: Abdomen is nondistended, soft, mild lower abdominal tenderness positive.  No guarding, no rigidity.. No organomegaly or masses felt. Normal bowel sounds heard. Central nervous system: Alert and oriented. No focal neurological deficits. Extremities: Symmetric 5 x 5 power. Skin: No rashes, lesions or ulcers Psychiatry: Judgement and insight appear normal. Mood & affect appropriate.    Data Reviewed: I have personally reviewed following labs and imaging studies  CBC: Recent Labs  Lab 10/13/22 0501 10/14/22 0324 10/15/22 0337 10/17/22 0246  WBC 11.6* 9.2 8.6 7.2  NEUTROABS 9.5*  --  7.5  --   HGB 10.5* 10.6* 10.0* 9.3*  HCT 30.9* 31.1* 29.5* 27.6*  MCV 88.3 87.9 87.8 88.5  PLT 292 331 327 967    Basic Metabolic Panel: Recent Labs  Lab 10/13/22 0501 10/13/22 0521 10/14/22 0324 10/15/22 0337 10/17/22 0246  NA 138  --  136 140 138  K 3.0*   --  4.2 3.7 2.8*  CL 101  --  104 106 108  CO2 25  --  22 24 22   GLUCOSE 90  --  139* 111* 90  BUN <5*  --  8 7 <5*  CREATININE 1.14*  --  1.01* 0.87 0.89  CALCIUM 8.5*  --  8.5* 8.8* 8.2*  MG  --  1.7  --  1.9 1.8  PHOS  --   --   --  2.8  --     GFR: Estimated Creatinine Clearance: 93 mL/min (by C-G formula based on SCr of 0.89 mg/dL). Liver Function Tests: Recent Labs  Lab 10/13/22 0501 10/14/22 0324 10/15/22 0337  AST 13* 14* 14*  ALT 16 16 17   ALKPHOS 32* 34* 27*  BILITOT 0.8 1.0 0.7  PROT 6.4* 6.5 6.4*  ALBUMIN 3.0* 2.9* 2.9*    Recent Labs  Lab 10/13/22 0501 10/15/22 1129  LIPASE 179* 82*    No results for input(s): "AMMONIA" in the last 168 hours. Coagulation Profile: No results for input(s): "INR", "PROTIME" in the last 168 hours. Cardiac Enzymes: No results for input(s): "CKTOTAL", "CKMB", "CKMBINDEX", "TROPONINI" in the last 168 hours. BNP (last 3 results) No results for input(s): "PROBNP"  in the last 8760 hours. HbA1C: No results for input(s): "HGBA1C" in the last 72 hours. CBG: No results for input(s): "GLUCAP" in the last 168 hours. Lipid Profile: No results for input(s): "CHOL", "HDL", "LDLCALC", "TRIG", "CHOLHDL", "LDLDIRECT" in the last 72 hours. Thyroid Function Tests: No results for input(s): "TSH", "T4TOTAL", "FREET4", "T3FREE", "THYROIDAB" in the last 72 hours. Anemia Panel: No results for input(s): "VITAMINB12", "FOLATE", "FERRITIN", "TIBC", "IRON", "RETICCTPCT" in the last 72 hours. Sepsis Labs: No results for input(s): "PROCALCITON", "LATICACIDVEN" in the last 168 hours.  No results found for this or any previous visit (from the past 240 hour(s)).     Radiology Studies: No results found.  Scheduled Meds:  enoxaparin (LOVENOX) injection  40 mg Subcutaneous Q24H   feeding supplement (KATE FARMS STANDARD 1.4)  325 mL Oral BID BM   folic acid  1 mg Oral Daily   pantoprazole  40 mg Oral Daily   potassium chloride  40 mEq Oral BID    predniSONE  40 mg Oral Q breakfast   sodium chloride flush  3 mL Intravenous Q12H   Continuous Infusions:  sodium chloride 75 mL/hr at 10/17/22 0532   metronidazole 500 mg (10/17/22 0250)     LOS: 2 days   Time spent: 35 minutes    Sharon [name] Estill Cotta, MD [facility]  If 7PM-7AM, please contact night-coverage [email or web address] 10/17/2022, 1:02 PM

## 2022-10-17 NOTE — Evaluation (Signed)
Physical Therapy Evaluation Patient Details Name: Sharon [name] MRN: [id number] DOB: 08/30/1981 Today's Date: 10/17/2022  History of Present Illness  Pt is a 42 y.o. female admitted 1/17 with Crohn's colitis. Recent hospitalization at [facility], 1/10-1/14, for Crohn's disease of ileum with abscess and N&V. Pt left AMA. 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 PTA pt lived at home with husband, independent mobility and husband assisting with bathing. Pt currently with functional limitations due to the deficits listed below (see PT Problem List). On eval, pt required min guard assist transfers, and amb 25' without AD. Mobility/distance limited by pt c/o dizziness. Orthostatic vitals stable 1/20. After pt c/o dizziness with stance. She returned to sitting EOB to obtain BP. BP sitting 121/88. BP with initial stance 102/82. Pt reporting she was too dizzy to continue standing and abruptly sat. LE exercises performed EOB, then second ambulation attempt resulting in amb of 25' with slow, guarded, shuffle gait. Pt in bed on PT exit. After session, pt observed amb to/from bathroom alone, managing IV pole. Pt lethargic throughout session. Falling asleep during conversation, keeping eyes closed, and giving minimal effort to exercises and mobility. Pt will benefit from skilled PT to increase their independence and safety with mobility to allow discharge to the venue listed below.          Recommendations for follow up therapy are one component of a multi-disciplinary discharge planning process, led by the attending physician.  Recommendations may be updated based on patient status, additional functional criteria and insurance authorization.  Follow Up Recommendations No PT follow up      Assistance Recommended at Discharge PRN  Patient can return home with the following  A little help with walking and/or transfers;Assistance with  cooking/housework;Assist for transportation;A little help with bathing/dressing/bathroom;Help with stairs or ramp for entrance    Equipment Recommendations None recommended by PT  Recommendations for Other Services       Functional Status Assessment Patient has had a recent decline in their functional status and demonstrates the ability to make significant improvements in function in a reasonable and predictable amount of time.     Precautions / Restrictions Precautions Precautions: Fall;Other (comment) Precaution Comments: watch orthostatic BP      Mobility  Bed Mobility Overal bed mobility: Modified Independent                  Transfers Overall transfer level: Needs assistance Equipment used: None Transfers: Sit to/from Stand Sit to Stand: Min guard           General transfer comment: min guard for safety    Ambulation/Gait Ambulation/Gait assistance: Min guard Gait Distance (Feet): 25 Feet Assistive device: None Gait Pattern/deviations: Step-through pattern, Decreased stride length, Shuffle, Drifts right/left Gait velocity: decreased Gait velocity interpretation: <1.8 ft/sec, indicate of risk for recurrent falls   General Gait Details: slow, guarded shuffle gait. Distance limited by dizziness.  Stairs            Wheelchair Mobility    Modified Rankin (Stroke Patients Only)       Balance Overall balance assessment: Mild deficits observed, not formally tested                                           Pertinent Vitals/Pain Pain Assessment Pain Assessment: No/denies pain  Home Living Family/patient expects to be discharged to:: Private residence Living Arrangements: Spouse/significant other Available Help at Discharge: Family Type of Home: Other(Comment) (duplex) Home Access: Stairs to enter Entrance Stairs-Rails: Psychiatric nurse of Steps: 6   Home Layout: One level Home Equipment: None       Prior Function Prior Level of Function : Needs assist       Physical Assist : ADLs (physical)     Mobility Comments: community ambulator, does not drive ADLs Comments: husband assists with bathing     Hand Dominance        Extremity/Trunk Assessment   Upper Extremity Assessment Upper Extremity Assessment: Generalized weakness    Lower Extremity Assessment Lower Extremity Assessment: Generalized weakness    Cervical / Trunk Assessment Cervical / Trunk Assessment: Normal  Communication   Communication: No difficulties  Cognition Arousal/Alertness: Lethargic Behavior During Therapy: Flat affect Overall Cognitive Status: Within Functional Limits for tasks assessed                                 General Comments: Repeatedly falling asleep during conversation. Therapist having to repeat questions, and give tactile cues to stay awake. Pt tending to keep eyes closed.        General Comments General comments (skin integrity, edema, etc.): Orthostatic vitals taken yesterday by nursing and found to be stable. Upon standing, pt with c/o dizziness and returned to sitting EOB. BP sitting 121/88. BP with initial stance 102/82. Pt sitting abruptly after stating she is too dizzy to continue standing.    Exercises General Exercises - Lower Extremity Ankle Circles/Pumps: AROM, 10 reps, Seated, Both Long Arc Quad: AROM, Right, Left, 10 reps, Seated Hip Flexion/Marching: AROM, Right, Left, 5 reps, Seated   Assessment/Plan    PT Assessment Patient needs continued PT services  PT Problem List Decreased strength;Decreased activity tolerance;Decreased mobility       PT Treatment Interventions Balance training;Gait training;Stair training;Functional mobility training;Therapeutic activities;Patient/family education;Therapeutic exercise    PT Goals (Current goals can be found in the Care Plan section)  Acute Rehab PT Goals Patient Stated Goal: home PT Goal Formulation:  With patient Time For Goal Achievement: 10/31/22 Potential to Achieve Goals: Good    Frequency Min 3X/week     Co-evaluation               AM-PAC PT "6 Clicks" Mobility  Outcome Measure Help needed turning from your back to your side while in a flat bed without using bedrails?: None Help needed moving from lying on your back to sitting on the side of a flat bed without using bedrails?: None Help needed moving to and from a bed to a chair (including a wheelchair)?: A Little Help needed standing up from a chair using your arms (e.g., wheelchair or bedside chair)?: A Little Help needed to walk in hospital room?: A Little Help needed climbing 3-5 steps with a railing? : A Little 6 Click Score: 20    End of Session Equipment Utilized During Treatment: Gait belt Activity Tolerance: Treatment limited secondary to medical complications (Comment) (dizziness) Patient left: in bed;with call bell/phone within reach;with family/visitor present Nurse Communication: Mobility status PT Visit Diagnosis: Unsteadiness on feet (R26.81);Muscle weakness (generalized) (M62.81)    Time: 4696-2952 PT Time Calculation (min) (ACUTE ONLY): 28 min   Charges:   PT Evaluation $PT Eval Moderate Complexity: 1 Mod PT Treatments $Gait Training: 8-22 mins  Aida Raider, PT  Office # [phone] Pager [phone]   Ilda Foil 10/17/2022, 9:00 AM

## 2022-10-18 DIAGNOSIS — K50119 Crohn's disease of large intestine with unspecified complications: Secondary | ICD-10-CM | POA: Diagnosis not present

## 2022-10-18 LAB — BASIC METABOLIC PANEL
Anion gap: 8 (ref 5–15)
BUN: <5 mg/dL — ABNORMAL LOW (ref 6–20)
CO2: 22 mmol/L (ref 22–32)
Calcium: 8.7 mg/dL — ABNORMAL LOW (ref 8.9–10.3)
Chloride: 112 mmol/L — ABNORMAL HIGH (ref 98–111)
Creatinine, Ser: 0.9 mg/dL (ref 0.44–1.00)
GFR, Estimated: >60 mL/min (ref >60)
Glucose, Bld: 115 mg/dL — ABNORMAL HIGH (ref 70–99)
Potassium: 3.5 mmol/L (ref 3.5–5.1)
Sodium: 142 mmol/L (ref 135–145)

## 2022-10-18 LAB — MAGNESIUM: Magnesium: 1.8 mg/dL (ref 1.7–2.4)

## 2022-10-18 MED ORDER — BANATROL TF EN LIQD
60.0000 mL | Freq: Two times a day (BID) | ENTERAL | Status: DC
  Administered 2022-10-18 – 2022-10-19 (×3): 60 mL via ORAL
  Filled 2022-10-18 (×2): qty 60

## 2022-10-18 MED ORDER — SACCHAROMYCES BOULARDII 250 MG PO CAPS
250.0000 mg | ORAL_CAPSULE | Freq: Two times a day (BID) | ORAL | Status: DC
  Administered 2022-10-18 – 2022-10-19 (×2): 250 mg via ORAL
  Filled 2022-10-18 (×2): qty 1

## 2022-10-18 MED ORDER — ONDANSETRON HCL 4 MG/2ML IJ SOLN
4.0000 mg | Freq: Four times a day (QID) | INTRAMUSCULAR | Status: DC
  Administered 2022-10-18 – 2022-10-19 (×3): 4 mg via INTRAVENOUS
  Filled 2022-10-18 (×5): qty 2

## 2022-10-18 NOTE — Care Management Important Message (Signed)
Important Message  Patient Details  Name: Sharon [name] MRN: [id number] Date of Birth: 25-Jun-1981   Medicare Important Message Given:  Yes     Hannah Beat 10/18/2022, 3:24 PM

## 2022-10-18 NOTE — Discharge Instructions (Signed)
INFLAMMATORY BOWEL DISEASE (IBD): CROHN'S DISEASE AND ULCERATIVE COLITIS NUTRITION THERAPY (2018)  If you have IBD, you might not be able to digest all the food you eat, so you may need more vitamins and minerals. Your medications might affect your ability to eat or how your body absorbs nutrients.  How much fiber you should eat depends on your symptoms and the amount of inflammation in your intestines. If you are taking prednisone or budesonide medications, then you should limit the amount of fiber that you are eating. If you are experiencing symptoms like diarrhea, abdominal pain, low-fiber foods are the easiest to digest and are less irritating for your intestines. If you don't have symptoms, then check with your health care provider or registered dietitian nutritionist (RDN) to see if you may add more fiber to your diet. It's also important to eat enough protein foods while you are on the IBD diet.  Tips Eat small meals or snacks every 3 or 4 hours. Do not skip meals. When you have symptoms, or if you are taking prednisone or budesonide, eat the foods in the Recommended Foods chart. These foods are lower in fiber. Eat a protein food or dairy product at every meal or snack if your body can tolerate it. See the Foods Recommended table for ideas. Drink a lot of fluids, at least 8 cups each day. Limit caffeinated, sugary drinks and beverages made with sugar substitutes. Eat foods that have probiotics (yogurt, kefir) and prebiotics (bananas). Ask your RDN for other suggestions. You may need to take supplements as part of your IBD treatment. Take a chewable multivitamin with minerals. Ask your RDN for recommendations. If you are taking methotrexate or sulfasalazine, take one multivitamin with minerals and a supplement with 1 milligram of folic acid daily. Take a chewable calcium supplement with vitamin D if you are not getting enough calcium from your diet. Check with your health care provider before  starting probiotic or prebiotic supplements. When you don't have symptoms (no blood in your stools), you are no longer taking prednisone or budesonide, and your inflammation is mild, your health care provider may recommend that you begin including whole grains and a variety of fruits and vegetables in your diet. Only add one to two new foods to your diet each week in small amounts and monitor your symptoms. Stop eating the new food if you develop abdominal pain or diarrhea. You can try it again after a few weeks.  Foods Recommended These foods are low in fiber. You can include them in your diet anytime, but they should be part of your diet when you have diarrhea and abdominal pain. Food Group Notes Foods Recommended  Grains Choose grain foods with less than 2 grams of fiber per serving. The grams of fiber in a serving are listed on the Nutrition Facts label of packaged foods. Any containing milk may contain lactose. Bread, bagels, rolls, crackers, cereals, and pasta made from white or refined flour White rice Cream of wheat or rice Grits, refined Cereals made from refined grains without added fiber, and low in sugar  Protein Foods Use broth or water to cook meats at a lower temperature or cover the dish when baked in the oven, so the food cooks in its own juices. Crockpots work well with low heat and slow cooking to make meats tender. Or marinate meats first with an acidic ingredient, such as vinegar and oil, lemon juice, wine, or by using chopped raw pineapple and then pour it off  before cooking.  Cook protein foods well to reduce bacteria. Tender, well-cooked meats, prepared without added fat: poultry, fish, lean beef and pork Deli meats--tender, thinly sliced; heat to steaming Eggs, well-cooked Tofu Smooth nut and seed butters: Peanut, almond, and sunflower seed  Dairy Choose lactose-free products if you have lactose intolerance. Symptoms of lactose intolerance occur after drinking regular  milk or eating foods made from milk (milk solids, whey, cream, butter, or products with "may contain milk" on the label). Symptoms include diarrhea, nausea, abdominal pain, and bloating. Choose yogurt with live active cultures (check labels). Foods marked with an asterisk (*) have lactose. Buttermilk* Evaporated, fat-free, 1%, and 2% milk* Lactose-free milk Fortified non-dairy milks: almond, cashew, coconut, or rice (be aware that these options are not good sources of protein so you will need to eat an additional protein food) Fortified pea milk and soymilk (may cause gas and bloating) Yogurt*/lactose-free yogurt Low-fat cheese* (aged and hard cheeses such as cheddar, swiss, or parmesan may have less lactose and result in less symptoms; limit to 1 to 2 ounces per serving to decrease lactose intake) Cottage cheese*/lactose-free cottage cheese Low-fat ice cream*/lactose-free ice cream Sherbet  Vegetables See the Foods Not Recommended table for vegetables you should avoid when you have diarrhea or abdominal pain. Well-cooked vegetables without seeds or skins, such as green beans or carrots Potatoes (white, red, or yellow) without skin Sweet potatoes contain more fiber; remove skin and do not eat more than half at one meal   Strained vegetable juice Summer squash: yellow or zucchini without skins or seeds  Fruit See the Foods Not Recommended table of fruits you should avoid when you have diarrhea or abdominal pain. Fruit juices diluted by half with water may be tolerated better.  Apple, peeled Banana, ripe Melons: cantaloupe, honeydew, watermelon Canned, soft fruits or fruit cups (in juice); avoid pineapple  Oils Limit fats and oils to less than 8 teaspoons per day. Choose oils more often than solid fats Vegetable oils: canola, olive, peanut Mayonnaise  Beverages   Water Decaffeinated coffee Caffeine-free tea Rehydration beverages   Foods Not Recommended These foods are higher in fat and  fiber and should not be eaten when you have diarrhea and abdominal pain. It is OK to eat these foods if you do not have symptoms and your inflammation is low. Food Group Notes Foods Not Recommended  Grains Do not eat grains foods with 2 or more grams of fiber per serving. Whole wheat or whole grain breads, rolls, crackers, or pasta Brown or wild rice Quinoa Cereals made from whole grains; oatmeal, bran or shredded wheat   Any grain foods made with seeds or nuts Popcorn  Protein Foods   Fried eggs and meats, including sausage and bacon Lunch meats, such as bologna or salami Hot dogs Tough or chewy cuts of meat (grilled steak or pork chops) All dried beans and peas; hummus Nuts and coconut Chunky nut butters  Dairy Avoid higher fiber or higher fat foods that may not be tolerated as well. Fruited yogurt or yogurt with granola or mix-ins Whole milk Half-and-half, cream, sour cream Ice cream (unless it is low-fat or fat-free)  Vegetables The vegetables listed here are gas forming and/or have a high amount of fiber. Beets, broccoli, brussels sprouts, cabbage and sauerkraut, cauliflower, corn, greens (mustard, turnip, collards), green peas, lima beans, mushrooms, okra, onions, parsnips, peppers, potato skins, salads, spinach, winter squash  Fruit Canned fruit in heavy syrup and sweetened juices have a lot of  sugar, which may make diarrhea worse. All raw fruits except for ones on Foods Recommended table  Berries, canned cherries Dried fruits, including raisins and prunes Prune juice and sweetened fruit juices  Beverages Drinking beverages with sugar or corn syrup may make diarrhea worse. Drinks with caffeine, such as coffee, tea, cola, some sport drinks Sugar-free drinks with sugar substitutes (aspartame, sucralose, sorbitol) Sugary drinks: sweet tea, drink packets added to water Alcoholic drinks Soda or other beverages made with sugar or corn syrup if they make diarrhea worse.  Other Sugar  alcohols may cause diarrhea. Sugar alcohols (erythritol, mannitol, sorbitol, xylitol), which are often found in sugar-free gum and candy, as well as some medications.    HIGH-FIBER NUTRITION THERAPY (2022)  Fiber and fluid may help you feel less constipated and bloated and can also help ease diarrhea. Increase fiber slowly over the course of a few weeks. This will keep your symptoms from getting worse.  Tips Tips for Adding Fiber to Your Eating Plan Slowly increase the amount of fiber you eat to 25 to 35 grams per day. Eat whole grain breads and cereals. Look for choices with 100% whole wheat, rye, oats, or bran as the first or second ingredient. Have brown or wild rice instead of white rice or potatoes. Enjoy a variety of grains such as barley, oats, farro, kamut, and quinoa. Bake with whole wheat flour. You can use it to replace some white or all-purpose flour in recipes. Add dried beans and peas to casseroles or soups. Eat fruits and vegetables with peels or skins on. Choose fresh fruit and vegetables instead of juices. Check the Nutrition Facts labels and try to choose products with at least 4 g dietary fiber per serving. Drink at least 8 cups of fluid per day. You may need even more fluid as you eat higher amounts of fiber. Fluid helps your body process fiber without discomfort.  Foods High in Fiber (4 grams or more) Food Group Food Serving  Grains Cereal, bran  cup   Cereal, shredded wheat 1 cup   Oatmeal 1 cup   Popcorn 1 cup   Quinoa  cup   Wheat bran 3 tablespoons  Protein Foods Beans, canned, such as garbanzo or kidney  cup   Flaxseed, ground 2 tablespoons   Lentils  cup   Peas  cup   Soybeans  cup  Vegetables Potato with skin 1 medium   Mixed vegetables, frozen  cup  Fruit Blackberries or raspberries  cup   Coconut 1 ounce   Pear 1 medium  Foods Moderate in Fiber (1-3 grams) Food Group Food Serving  Grains Bread: whole wheat, cracked wheat, pumpernickel, or  rye 1 slice   Bun, hot dog or hamburger 1   Crackers, whole grain 4   English muffin 1   Pasta: chickpea, lentil, or whole grain  cup   Rice: brown or wild  cup   Wheat germ 2 tablespoons   Whole grains: barley, bulgur, farro, freekeh, millet, or spelt  cup  Protein Foods Nuts, all types  cup   Nut butters: almond, cashew, peanut 2 tablespoons   Seeds: pumpkin or sesame 2 tablespoons   Veggie burger 1  Vegetables Beets  cup   Broccoli  cup   Brussels sprouts  cup   Cabbage  cup   Carrots  cup   Cauliflower  cup   Corn  cup   Eggplant  cup   Greens: beet, collard, kale, or turnip  cup  Green beans  cup   Okra  cup   Spinach  cup   Squash  cup   Tomato sauce  cup   Tomato 1 medium  Fruit Apple 1 medium   Applesauce  cup   Avocado  cup   Banana 1 medium   Blueberries, cranberries, or strawberries  cup   Cherries 10   Dates 4 small   Fruit, canned  cup   Grapefruit    Kiwi 1   Orange 1 medium   Papaya    Peach 1 medium   Pineapple  cup   Plum 1   Prune juice  cup   Prunes 4   Raisins  cup   Tangerine 1 medium

## 2022-10-18 NOTE — Progress Notes (Signed)
Nutrition Follow-up  DOCUMENTATION CODES:  Not applicable  INTERVENTION:  Recommend continuing to slowly increase her intake of solid foods Continue [employer] 1.4 PO BID, each supplement provides 455 kcal and 20 grams protein as tolerated Trial Carnation Instant Breakfast po TID with soy milk with all meals- each supplement provides 140 kcal and 5g of protein Banatrol BID-provides 45kcal, 5g soluble fiber and 2g protein per serving. Added "IBD: Crohn's Disease and Ulcerative Colitis" and "High Fiber Nutrition Therapy" handout to AVS  Referral placed for ongoing outpatient nutrition follow up Consider adding probiotic supplement to daily regimen; reached out to MD with recommendation  NUTRITION DIAGNOSIS:  Inadequate oral intake related to poor appetite as evidenced by per patient/family report. - remains ongoing  GOAL:  Patient will meet greater than or equal to 90% of their needs - goal unmet, addressing via Soft diet and nutrition supplements  MONITOR:  PO intake, Supplement acceptance, Labs, Diet advancement, Weight trends  REASON FOR ASSESSMENT:  Consult Assessment of nutrition requirement/status, Diet education  ASSESSMENT:  42 y.o. female presented to the ED with abdominal pain and vomiting. Pt recently admitted to [facility] 1/10-1/14 with Crohn's colitis and abscess, diet was advanced and left AMA. PMH includes Grave's disease and Crohn's disease. Pt admitted again with Crohn's colitis, hypokalemia, and near syncope episodes.  Called and spoke with pt via phone call to room as she was sleeping soundly during initial visit. She states that at this time she is only tolerating clear liquids. She does not enjoy the hospital food. Encouraged her to have family provide low fiber containing foods. She states that her husband brought her spinach but this is all she has been able to eat. She endorses ongoing nausea and diarrhea.   Pt has tried [employer] but endorses diarrhea. She is  willing to trial one more and only sip on it to test her tolerance. Unfortunately, all supplements in the hospital contain whey protein isolate which she endorses having an intolerance to. Although [employer] contains milk, she is willing to try this with soy milk to see if this may be a supplement she can continue. If not, we discussed other potential options for plant based protein supplements that she could trial outpatient.   Pt had questions regarding what she should eat at home. Made her aware during a "flare" it is recommended to limit your fiber and slowly begin to increase fiber again when feeling better. Handouts have been added to AVS but briefly discussed low fiber nutrition therapy. Referral also placed for ongoing outpatient follow up.   Pt endorses ongoing chronic diarrhea. She states that she may experience this >15 times per day. Food will usually just pass through her. She states that during admission she has experienced diarrhea about 4 times per day although this is not reflected in nursing documentation. Will trial adding in Banatrol twice daily to see if this helps improve her stool frequency over time.   Pt expressed concerns over increased blood sugars. This is likely r/t acute inflammatory process and addition of steroids.   Medications: folvite, zofran, protonix, prednisone, IV abx  Labs reviewed  Diet Order:   Diet Order             DIET SOFT Room service appropriate? Yes; Fluid consistency: Thin  Diet effective now                   EDUCATION NEEDS:  Education needs have been addressed  Skin:  Skin  Assessment: Reviewed RN Assessment  Last BM:  1/21 (type 7)  Height:  Ht Readings from Last 1 Encounters:  10/13/22 5\' 6"  (1.676 m)    Weight:  Wt Readings from Last 1 Encounters:  10/13/22 86.4 kg    Ideal Body Weight:  59.1 kg  BMI:  Body mass index is 30.74 kg/m.  Estimated Nutritional Needs:   Kcal:  2000-2200  Protein:   100-115 grams  Fluid:  >/= 2 L  Clayborne Dana, RDN, LDN Clinical Nutrition

## 2022-10-18 NOTE — TOC Progression Note (Signed)
Transition of Care [facility]) - Progression Note    Patient Details  Name: Sharon [name] MRN: [id number] Date of Birth: 10-Mar-1981  Transition of Care [facility]) CM/SW Contact  Loletha Grayer Beverely Pace, RN Phone Number: 10/18/2022, 12:05 PM  Clinical Narrative:     [facility] Team continues to follow patient for needs.        Expected Discharge Plan and Services                                               Social Determinants of Health (SDOH) Interventions SDOH Screenings   Food Insecurity: No Food Insecurity (10/14/2022)  Tobacco Use: Unknown (10/13/2022)    Readmission Risk Interventions     No data to display

## 2022-10-18 NOTE — Progress Notes (Signed)
Assisted patient to and from bathroom.

## 2022-10-18 NOTE — Progress Notes (Signed)
Helped patient to and from bathroom. Zofran given. Also let patient know times of all medications that will be given for the rest of shift. Went and got patient 2 bed pads and 4 mesh panties as requested. All needs met at this time will continue to monitor patient.

## 2022-10-18 NOTE — Progress Notes (Signed)
PROGRESS NOTE  Sharon [name]  DOB: 05-03-1981  PCP: Patient, No Pcp Per ZOX:[id number]  DOA: 10/13/2022  LOS: 3 days  Hospital Day: 6  Brief narrative: Sharon [name] is a 42 y.o. female with PMH significant for Crohn's disease complicated by multiple strictures and surgical interventions in the past who was recently hospitalized at [facility] long 1/10 -1/14 for Crohn's colitis with abscess.  She was treated with steroids and antibiotics.  She apparently left AMA on 1/14. She states 'I was confused and made wrong decision.' After she left, she continued to have symptoms at home.  Also started getting lightheaded, poor oral intake and hence returned back to the ED on 1/17.  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 She follows up with Dr. Sharmon Revere, GI and Dr. Byrd Hesselbach, general surgery and Dr. Hetty Ely, pain management at [facility]. She is status post ileocecectomy 2004.  Per previous notes, her last dose of Stelara was 8 weeks ago.  She is reportedly waiting for approval to start this Skyrizi.  In the ED, patient was afebrile, hemodynamically stable, breathing on room air Labs showed potassium low at 3, BUN/creatinine normal, lipase elevated 179, WC count mildly elevated 11.6, hemoglobin 10.5 Urinalysis with hazy yellow urine with 20 ketones, negative leukocytes, negative nitrite CT abdomen pelvis showed 5-6 cm segment of distal ileum just proximal to the neo terminal ileum demonstrating circumferential wall thickening and perienteric edema/inflammation. This is immediately distal to a dilated segment consistent with inflammatory stricture. There is some fluid along the more distal inflammatory stricture, but is less focal than on the prior study and no rim enhancing or organized mesenteric abscess evident  There is also a second area of circumferential wall thickening just proximal to the dilated segment.  Generally, imaging features suggest inflammatory bowel disease.  Patient was started on IV fluid, IV Solu-Medrol, IV antibiotics, IV analgesics Admitted to [facility] GI consult was obtained.  Subjective: Patient was seen and examined this morning.   Lying on bed.  States she does not have abdominal pain anymore but still nauseous and has been on a liquid diet.  She also reports dizziness every time she stands up.    Assessment and plan: Crohn's colitis History of Crohn's disease complicated by strictures S/p ileocecectomy 2004 CT scan with evidence of colitis but no abscess at this time On admission, patient was restarted on IV antibiotics, IV steroids, IV fluid.  GI consulted Currently on IV cefepime and IV Flagyl  Initially, she was also started on IV Solu-Medrol 40 mg twice daily.  Subsequently switched to oral prednisone.  Per previous documentation, GI recommended at discharge prednisone 40 mg daily for a week to taper by 10 mg each week. Currently abdominal pain has improved but continues to feel nauseated and is only taking liquids. Today, I scheduled her on Zofran 4 mg every 6 hours.  I will encourage to try soft diet. Continue to monitor symptoms.  Avoid IV pain meds Recent Labs  Lab 10/13/22 0501 10/14/22 0324 10/15/22 0337 10/17/22 0246  WBC 11.6* 9.2 8.6 7.2    Orthostatic hypotension Continue treatment orthostatic despite adequate hydration.  Currently continued on normal saline.  Monitor orthostatic blood pressure this point again.  Maintain IV fluid for next 24 hours.  Elevated lipase Mildly elevated lipase to 179.  Probably because of nausea.  CT abdomen did not suggest pancreatitis.  Lipase level improved on repeat check.  Chest Recent Labs  Lab  10/13/22 0501 10/14/22 0324 10/15/22 0337 10/15/22 1129 10/17/22 0246  AST 13* 14* 14*  --   --   ALT 16 16 17   --   --   ALKPHOS 32* 34* 27*  --   --   BILITOT 0.8 1.0 0.7  --   --   PROT 6.4* 6.5 6.4*  --   --    ALBUMIN 3.0* 2.9* 2.9*  --   --   LIPASE 179*  --   --  82*  --   PLT 292 331 327  --  238   Chronic anemia Hemoglobin slightly low at baseline close to 10.  Remains stable.  Continue monitor Recent Labs    10/09/22 0618 10/10/22 0522 10/13/22 0501 10/14/22 0324 10/15/22 0337 10/17/22 0246  HGB  --  8.9* 10.5* 10.6* 10.0* 9.3*  MCV  --  91.6 88.3 87.9 87.8 88.5  VITAMINB12 2,161*  --   --   --   --   --   FOLATE 5.9*  --   --   --   --   --   TIBC 221*  --   --   --   --   --   IRON 56  --   --   --   --   --     Mobility: Encourage ambulation  Goals of care   Code Status: Full Code     DVT prophylaxis:  enoxaparin (LOVENOX) injection 40 mg Start: 10/13/22 1530   Antimicrobials: IV cefepime, IV Flagyl Fluid: NS at 100 mill per hour Consultants: None Family Communication: Husband at bedside  Scheduled Meds:  enoxaparin (LOVENOX) injection  40 mg Subcutaneous Q24H   feeding supplement (KATE FARMS STANDARD 1.4)  325 mL Oral BID BM   folic acid  1 mg Oral Daily   ondansetron (ZOFRAN) IV  4 mg Intravenous Q6H   pantoprazole  40 mg Oral Daily   predniSONE  40 mg Oral Q breakfast   sodium chloride flush  3 mL Intravenous Q12H    PRN meds: acetaminophen **OR** acetaminophen, HYDROcodone-acetaminophen, polyethylene glycol, promethazine   Infusions:   sodium chloride 100 mL/hr at 10/18/22 0330   ceFEPime (MAXIPIME) IV 2 g (10/18/22 6045)   metronidazole 500 mg (10/18/22 0244)    Antimicrobials: Anti-infectives (From admission, onward)    Start     Dose/Rate Route Frequency Ordered Stop   10/17/22 1415  ceFEPIme (MAXIPIME) 2 g in sodium chloride 0.9 % 100 mL IVPB        2 g 200 mL/hr over 30 Minutes Intravenous Every 8 hours 10/17/22 1317     10/14/22 1400  ceFEPIme (MAXIPIME) 2 g in sodium chloride 0.9 % 100 mL IVPB        2 g 200 mL/hr over 30 Minutes Intravenous Every 8 hours 10/14/22 1250 10/15/22 0632   10/14/22 0300  metroNIDAZOLE (FLAGYL) IVPB 500 mg         500 mg 100 mL/hr over 60 Minutes Intravenous Every 12 hours 10/13/22 1520     10/13/22 1445  metroNIDAZOLE (FLAGYL) IVPB 500 mg        500 mg 100 mL/hr over 60 Minutes Intravenous  Once 10/13/22 1437 10/13/22 1750       Skin assessment:      Diet:  Diet Order             DIET SOFT Room service appropriate? Yes; Fluid consistency: Thin  Diet effective now  Nutritional status:  Body mass index is 30.74 kg/m.  Nutrition Problem: Inadequate oral intake Etiology: poor appetite Signs/Symptoms: per patient/family report      Status is: Observation Level of care: Telemetry Medical  Continue in-hospital care because: Continues to become orthostatic.  Still unable to tolerate diet.  Not ready for discharge  Dispo: The patient is from: Home              Anticipated d/c is to: Pending clinical course.  Hopefully home in 1 to 2 days              Patient currently is not medically stable to d/c.   Difficult to place patient No   Objective: Vitals:   10/18/22 0415 10/18/22 0826  BP: (!) 136/91 122/81  Pulse: (!) 53 64  Resp: 18 17  Temp: 97.7 F (36.5 C) 99.2 F (37.3 C)  SpO2: 98% 100%   No intake or output data in the 24 hours ending 10/18/22 1048  Filed Weights   10/13/22 0236  Weight: 86.4 kg   Weight change:  Body mass index is 30.74 kg/m.   Physical Exam: General exam: Pleasant, young African-American female. Skin: No rashes, lesions or ulcers. HEENT: Atraumatic, normocephalic, no obvious bleeding Lungs: Clear to auscultation bilaterally CVS: Regular rate and rhythm, no murmur GI/Abd soft, nondistended, abdomen tenderness improved, bowel sound present CNS: Alert, awake, oriented x 3 Psychiatry: Sad affect Extremities: No pedal edema, no calf tenderness  Data Review: I have personally reviewed the laboratory data and studies available.  F/u labs ordered Unresulted Labs (From admission, onward)    None       Total  time spent in review of labs and imaging, patient evaluation, formulation of plan, documentation and communication with family: 7 minutes  Signed, Terrilee Croak, MD [facility] 10/18/2022

## 2022-10-19 DIAGNOSIS — K50119 Crohn's disease of large intestine with unspecified complications: Secondary | ICD-10-CM | POA: Diagnosis not present

## 2022-10-19 MED ORDER — HYDROCODONE-ACETAMINOPHEN 5-325 MG PO TABS
1.0000 | ORAL_TABLET | Freq: Four times a day (QID) | ORAL | Status: DC | PRN
  Administered 2022-10-19: 2 via ORAL
  Filled 2022-10-19: qty 2

## 2022-10-19 MED ORDER — SACCHAROMYCES BOULARDII 250 MG PO CAPS: 250.0000 mg | ORAL_CAPSULE | Freq: Two times a day (BID) | ORAL | 0 refills | Status: AC

## 2022-10-19 MED ORDER — AMOXICILLIN-POT CLAVULANATE 875-125 MG PO TABS: 1.0000 | ORAL_TABLET | Freq: Two times a day (BID) | ORAL | 0 refills | Status: AC

## 2022-10-19 MED ORDER — PREDNISONE 10 MG PO TABS: ORAL_TABLET | ORAL | 0 refills | Status: AC

## 2022-10-19 MED ORDER — AMOXICILLIN-POT CLAVULANATE 875-125 MG PO TABS: 1.0000 | ORAL_TABLET | Freq: Two times a day (BID) | ORAL | Status: DC

## 2022-10-19 NOTE — TOC Initial Note (Addendum)
Transition of Care (TOC) - Initial/Assessment Note   Patient from home with husband who assists. Patient asking for home health aide to come assist her after discharge.   Patient asking for [facility] aide. Husband helps but he is not there all the time. Patient aware aide may only visit once a week to assist with bath .   Asked for orders for [facility] and aide. [facility] accepted referral. [facility] will schedule an admission visit and discuss frequency of visits etc. Patient aware and voiced understanding    PCP is Bethanne Ginger   PT now recommending HHPT. MD added to order [facility] with [place] accepted   Patient Details  Name: Josalynn Johndrow MRN: [id number] Date of Birth: 1981-02-11  Transition of Care [facility]) CM/SW Contact:    Marilu Favre, RN Phone Number: 10/19/2022, 11:56 AM  Clinical Narrative:                   Expected Discharge Plan: Home/Self Care Barriers to Discharge: Continued Medical Work up   Patient Goals and CMS Choice Patient states their goals for this hospitalization and ongoing recovery are:: to return to home          Expected Discharge Plan and Services   Discharge Planning Services: CM Consult   Living arrangements for the past 2 months: Single Family Home Expected Discharge Date: 10/19/22               DME Arranged: N/A                    Prior Living Arrangements/Services Living arrangements for the past 2 months: Single Family Home Lives with:: Spouse Patient language and need for interpreter reviewed:: Yes Do you feel safe going back to the place where you live?: Yes      Need for Family Participation in Patient Care: Yes (Comment) Care giver support system in place?: Yes (comment)   Criminal Activity/Legal Involvement Pertinent to Current Situation/Hospitalization: No - Comment as needed  Activities of Daily Living Home Assistive Devices/Equipment: None ADL Screening (condition at time of admission) Patient's  cognitive ability adequate to safely complete daily activities?: Yes Is the patient deaf or have difficulty hearing?: No Does the patient have difficulty seeing, even when wearing glasses/contacts?: No Does the patient have difficulty concentrating, remembering, or making decisions?: No Patient able to express need for assistance with ADLs?: No Does the patient have difficulty dressing or bathing?: No Independently performs ADLs?: Yes (appropriate for developmental age) Does the patient have difficulty walking or climbing stairs?: No Weakness of Legs: None Weakness of Arms/Hands: None  Permission Sought/Granted   Permission granted to share information with : No              Emotional Assessment Appearance:: Appears stated age Attitude/Demeanor/Rapport: Engaged Affect (typically observed): Adaptable Orientation: : Oriented to Self, Oriented to Place, Oriented to  Time, Oriented to Situation Alcohol / Substance Use: Not Applicable Psych Involvement: No (comment)  Admission diagnosis:  Orthostatic hypotension [I95.1] Dehydration [E86.0] Crohn's colitis ([place]) [K50.10] Near syncope [R55] Crohn's colitis, unspecified complication (HCC) [X44.818] Nausea and vomiting, unspecified vomiting type [R11.2] Patient Active Problem List   Diagnosis Date Noted   Crohn's colitis ([place]) 10/13/2022   Near syncope 10/13/2022   Orthostatic hypotension 10/13/2022   Normocytic anemia 56/31/4970   Folic acid deficiency 26/37/8588   Crohn's disease of ileum with abscess ([place]) 10/07/2022   Intractable nausea and vomiting 10/07/2022   Hypokalemia due to excessive gastrointestinal loss  of potassium 10/06/2022   Dehydration 10/06/2022   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 [place] [address] Phone: [phone] Fax: [phone]  [id number] [place], [place] - [address] [place] [address] Phone: [phone] Fax: [phone]     Social Determinants of Health (SDOH) Social History: SDOH Screenings   Food Insecurity: No Food Insecurity (10/14/2022)  Tobacco Use: Unknown (10/13/2022)   SDOH Interventions:     Readmission Risk Interventions     No data to display

## 2022-10-19 NOTE — Progress Notes (Signed)
Discharge instructions given to pt. Pt verbalized understanding of all teaching and had no further questions. 

## 2022-10-19 NOTE — Progress Notes (Signed)
Physical Therapy Treatment Patient Details Name: Sharon [name] MRN: [id number] DOB: [birth year] Today's Date: 10/19/2022   History of Present Illness Pt is a 42 y.o. female admitted 1/17 with Crohn's colitis and dizziness with falls. Recent hospitalization at [facility], 1/10-1/14, for Crohn's disease of ileum with abscess and N&V. Pt left AMA. 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 Has been to the bathroom in room but otherwise immobile. Discussed gaze stabilization with mobility for dizziness and worked on this with changes in position. Regardless of this though, pt became dizzy within 3 minutes of standing for each bout performed. Worked on standing, seated, and supine exercises and discussed the importance of maintaining mobility. Given that pt is still not tolerating community ambulation distances recommend HHPT and HHaide as she is alone when husband is at work. Pt agreeable. PT will continue to follow.    Recommendations for follow up therapy are one component of a multi-disciplinary discharge planning process, led by the attending physician.  Recommendations may be updated based on patient status, additional functional criteria and insurance authorization.  Follow Up Recommendations  Home health PT     Assistance Recommended at Discharge PRN  Patient can return home with the following Assistance with cooking/housework;Assist for transportation;A little help with bathing/dressing/bathroom;Help with stairs or ramp for entrance;A lot of help with walking and/or transfers   Equipment Recommendations  None recommended by PT    Recommendations for Other Services Other (comment) (HHaide)     Precautions / Restrictions Precautions Precautions: Fall;Other (comment) Precaution Comments: watch orthostatic BP Restrictions Weight Bearing Restrictions: No     Mobility  Bed Mobility Overal  bed mobility: Modified Independent             General bed mobility comments: no nystagmus noted with changes in position    Transfers Overall transfer level: Needs assistance Equipment used: None Transfers: Sit to/from Stand Sit to Stand: Min guard, Min assist           General transfer comment: needed min A for first stand as well as increased time as pt struggled to extend through hips and knees. Performed 3 more times with less assist needed but after 3rd time pt became very dizzy. Worked on gaze stabilization throughout all mobility    Ambulation/Gait               General Gait Details: pt did not feel like she could tolerate, became dizzy marching in place   Stairs             Wheelchair Mobility    Modified Rankin (Stroke Patients Only)       Balance Overall balance assessment: Mild deficits observed, not formally tested                                          Cognition Arousal/Alertness: Awake/alert Behavior During Therapy: Flat affect Overall Cognitive Status: Within Functional Limits for tasks assessed                                 General Comments: pt alert. Slowed responses and overall flat affect, seems down about her situation and how bad she feels and does not feel like she has help that  she needs when her husband is at work        Medical laboratory scientific officer: AROM, 10 reps, Supine General Exercises - Lower Extremity Ankle Circles/Pumps: AROM, 10 reps, Both, Supine Heel Slides: AROM, Both, 5 reps, Supine Hip Flexion/Marching: AROM, Right, Left, 5 reps, Seated Other Exercises Other Exercises: opposing hand grasp Other Exercises: shoulder flexion x10 bilaterally    General Comments General comments (skin integrity, edema, etc.): discussed OH and mgmt even though vitals have been stable. Went through supine exercises to perform before getting up. Pt demonstrated correctly       Pertinent Vitals/Pain Pain Assessment Pain Assessment: No/denies pain    Home Living                          Prior Function            PT Goals (current goals can now be found in the care plan section) Acute Rehab PT Goals Patient Stated Goal: home PT Goal Formulation: With patient Time For Goal Achievement: 10/31/22 Potential to Achieve Goals: Good Progress towards PT goals: Not progressing toward goals - comment (dizziness)    Frequency    Min 3X/week      PT Plan Discharge plan needs to be updated    Co-evaluation              AM-PAC PT "6 Clicks" Mobility   Outcome Measure  Help needed turning from your back to your side while in a flat bed without using bedrails?: None Help needed moving from lying on your back to sitting on the side of a flat bed without using bedrails?: None Help needed moving to and from a bed to a chair (including a wheelchair)?: A Little Help needed standing up from a chair using your arms (e.g., wheelchair or bedside chair)?: A Little Help needed to walk in hospital room?: Total Help needed climbing 3-5 steps with a railing? : Total 6 Click Score: 16    End of Session Equipment Utilized During Treatment: Gait belt Activity Tolerance: Treatment limited secondary to medical complications (Comment) (dizziness) Patient left: with call bell/phone within reach;in bed Nurse Communication: Mobility status PT Visit Diagnosis: Unsteadiness on feet (R26.81);Muscle weakness (generalized) (M62.81)     Time: 3299-2426 PT Time Calculation (min) (ACUTE ONLY): 22 min  Charges:  $Therapeutic Activity: 8-22 mins                     Leighton Roach, PT  Acute Rehab Services Secure chat preferred Office [place] 10/19/2022, 1:54 PM

## 2022-10-19 NOTE — Progress Notes (Signed)
Patient will be going to discharge lounge until her husband comes to pick her up

## 2022-10-19 NOTE — Discharge Summary (Signed)
Physician Discharge Summary  Sharon [name] TOI:[id number] DOB: 04/23/1981 DOA: 10/13/2022  PCP: Patient, No Pcp Per  Admit date: 10/13/2022 Discharge date: 10/19/2022  Admitted From: Home Discharge disposition: Home  Recommendations at discharge:  Complete the course of prednisone as recommended Continue to follow-up with the providers at [facility] oral hydration.   Brief narrative: Sharon [name] is a 42 y.o. female with PMH significant for Crohn's disease complicated by multiple strictures and surgical interventions in the past who was recently hospitalized at [facility] long 1/10 -1/14 for Crohn's colitis with abscess.  She was treated with steroids and antibiotics.  She apparently left AMA on 1/14. She states 'I was confused and made wrong decision.' After she left, she continued to have symptoms at home.  Also started getting lightheaded, poor oral intake and hence returned back to the ED on 1/17.  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 She follows up with Dr. Sharmon Revere, GI and Dr. Byrd Hesselbach, general surgery and Dr. Hetty Ely, pain management at [facility]. She is status post ileocecectomy 2004.  Per previous notes, her last dose of Stelara was 8 weeks ago.  She is reportedly waiting for approval to start this Skyrizi.  In the ED, patient was afebrile, hemodynamically stable, breathing on room air Labs showed potassium low at 3, BUN/creatinine normal, lipase elevated 179, WC count mildly elevated 11.6, hemoglobin 10.5 Urinalysis with hazy yellow urine with 20 ketones, negative leukocytes, negative nitrite CT abdomen pelvis showed 5-6 cm segment of distal ileum just proximal to the neo terminal ileum demonstrating circumferential wall thickening and perienteric edema/inflammation. This is immediately distal to a dilated segment consistent with inflammatory stricture. There is some fluid along  the more distal inflammatory stricture, but is less focal than on the prior study and no rim enhancing or organized mesenteric abscess evident  There is also a second area of circumferential wall thickening just proximal to the dilated segment. Generally, imaging features suggest inflammatory bowel disease.  Patient was started on IV fluid, IV Solu-Medrol, IV antibiotics, IV analgesics Admitted to [facility] GI consult was obtained.  Subjective: Patient was seen and examined this morning.   Walking inside the room. Patient states that she still feels dizzy on standing up.  She has been adequately hydrated and orthostatic blood pressure have improved. We had a 20-minute long conversation with the husband at bedside. Patient is medically stable for discharge and she finally understands it now.  Hospital course: Crohn's colitis History of Crohn's disease complicated by strictures S/p ileocecectomy 2004 CT scan with evidence of colitis but no abscess at this time On admission, patient was restarted on IV cefepime, IV Flagyl, IV steroids, IV fluid.  GI was consulted For abdominal symptoms have significantly improved.  She is able to tolerate soft diet.  No nausea, no tenderness. Subsequently switched to oral prednisone. GI recommendation on discharge her on prednisone 40 mg once a day for a week followed by 30 mg for another week followed by 20 mg for another week and then 10 mg for 1 week . Switch to oral Augmentin for next 7 days with probiotics, to complete 2 weeks course. Patient wants second opinion from GI at [facility]  Referral given. Recent Labs  Lab 10/13/22 0501 10/14/22 0324 10/15/22 0337 10/17/22 0246  WBC 11.6* 9.2 8.6 7.2    Orthostatic hypotension Patient was dehydrated and was positive for orthostatic hypotension.  She has received large amount of IV hydration.  She still complains that she gets dizzy on standing up but she has been seen walking from her bed to the bathroom  without any discomfort.   On repeat orthostatic blood pressure measurement this morning, there is no significant orthostatic drop. Encourage adequate oral hydration  Elevated lipase Mildly elevated lipase to 179.  Probably because of nausea.  CT abdomen did not suggest pancreatitis.  Lipase level improved on repeat check.   Recent Labs  Lab 10/13/22 0501 10/14/22 0324 10/15/22 0337 10/15/22 1129 10/17/22 0246  AST 13* 14* 14*  --   --   ALT 16 16 17   --   --   ALKPHOS 32* 34* 27*  --   --   BILITOT 0.8 1.0 0.7  --   --   PROT 6.4* 6.5 6.4*  --   --   ALBUMIN 3.0* 2.9* 2.9*  --   --   LIPASE 179*  --   --  82*  --   PLT 292 331 327  --  238   Chronic anemia Hemoglobin slightly low at baseline close to 10.  Remains stable.  Continue monitor Recent Labs    10/09/22 0618 10/10/22 0522 10/13/22 0501 10/14/22 0324 10/15/22 0337 10/17/22 0246  HGB  --  8.9* 10.5* 10.6* 10.0* 9.3*  MCV  --  91.6 88.3 87.9 87.8 88.5  VITAMINB12 2,161*  --   --   --   --   --   FOLATE 5.9*  --   --   --   --   --   TIBC 221*  --   --   --   --   --   IRON 56  --   --   --   --   --    Depression Resume Zoloft at home.  Follow-up with psychiatry as an outpatient.    Mobility: Encourage ambulation  Goals of care   Code Status: Full Code   Wounds:  -    Discharge Exam:   Vitals:   10/18/22 0826 10/18/22 1441 10/18/22 2022 10/19/22 0703  BP: 122/81 123/79 124/78 119/86  Pulse: 64 81 69 67  Resp: 17 17    Temp: 99.2 F (37.3 C) 99.2 F (37.3 C) 99.4 F (37.4 C) 98.8 F (37.1 C)  TempSrc: Oral Oral Oral Oral  SpO2: 100% 99% 100% 97%  Weight:      Height:        Body mass index is 30.74 kg/m.  General exam: Pleasant, young African-American female. Skin: No rashes, lesions or ulcers. HEENT: Atraumatic, normocephalic, no obvious bleeding Lungs: Clear to auscultation bilaterally CVS: Regular rate and rhythm, no murmur GI/Abd soft, nondistended, abdomen tenderness improved,  bowel sound present CNS: Alert, awake, oriented x 3 Psychiatry: Mood appropriate Extremities: No pedal edema, no calf tenderness  Follow ups:    Follow-up Information     [facility] Follow up.   Contact information: [address] [place] [phone]        [facility] Follow up.   Specialty: Gastroenterology Contact information: [address] [place] [phone]                Discharge Instructions:   Discharge Instructions     Amb Referral to Nutrition and Diabetic Education   Complete by: As directed    Ambulatory referral to Gastroenterology   Complete by: As directed  Patient wants second opinion for Crohn's disease.   What is the reason for referral?: Other   Call MD for:  difficulty breathing, headache or visual disturbances   Complete by: As directed    Call MD for:  extreme fatigue   Complete by: As directed    Call MD for:  hives   Complete by: As directed    Call MD for:  persistant dizziness or light-headedness   Complete by: As directed    Call MD for:  persistant nausea and vomiting   Complete by: As directed    Call MD for:  severe uncontrolled pain   Complete by: As directed    Call MD for:  temperature >100.4   Complete by: As directed    Diet general   Complete by: As directed    Soft diet   Discharge instructions   Complete by: As directed    Recommendations at discharge:   Complete the course of prednisone as recommended  Continue to follow-up with the providers at [facility] oral hydration.  General discharge instructions: Follow with Primary MD Patient, No Pcp Per in 7 days  Please request your PCP  to go over your hospital tests, procedures, radiology results at the follow up. Please get your medicines reviewed and adjusted.  Your PCP may decide to repeat certain labs or tests  as needed. Do not drive, operate heavy machinery, perform activities at heights, swimming or participation in water activities or provide baby sitting services if your were admitted for syncope or siezures until you have seen by Primary MD or a Neurologist and advised to do so again. [place] Controlled Substance Reporting System database was reviewed. Do not drive, operate heavy machinery, perform activities at heights, swim, participate in water activities or provide baby-sitting services while on medications for pain, sleep and mood until your outpatient physician has reevaluated you and advised to do so again.  You are strongly recommended to comply with the dose, frequency and duration of prescribed medications. Activity: As tolerated with Full fall precautions use walker/cane & assistance as needed Avoid using any recreational substances like cigarette, tobacco, alcohol, or non-prescribed drug. If you experience worsening of your admission symptoms, develop shortness of breath, life threatening emergency, suicidal or homicidal thoughts you must seek medical attention immediately by calling 911 or calling your MD immediately  if symptoms less severe. You must read complete instructions/literature along with all the possible adverse reactions/side effects for all the medicines you take and that have been prescribed to you. Take any new medicine only after you have completely understood and accepted all the possible adverse reactions/side effects.  Wear Seat belts while driving. You were cared for by a hospitalist during your hospital stay. If you have any questions about your discharge medications or the care you received while you were in the hospital after you are discharged, you can call the unit and ask to speak with the hospitalist or the covering physician. Once you are discharged, your primary care physician will handle any further medical issues. Please note that NO REFILLS for any  discharge medications will be authorized once you are discharged, as it is imperative that you return to your primary care physician (or establish a relationship with a primary care physician if you do not have one).   Increase activity slowly   Complete by: As directed        Discharge Medications:   Allergies as of 10/19/2022  Reactions   Other Hives   Adhesive for EKG leads - hives at location        Medication List     STOP taking these medications    CombiPatch 0.05-0.14 MG/DAY Generic drug: estradiol-norethindrone   JUNEL FE 24 PO   loperamide 2 MG tablet Commonly known as: IMODIUM A-D   QC TUMERIC COMPLEX PO       TAKE these medications    amoxicillin-clavulanate 875-125 MG tablet Commonly known as: AUGMENTIN Take 1 tablet by mouth every 12 (twelve) hours for 7 days. What changed:  when to take this additional instructions   omeprazole 20 MG capsule Commonly known as: PRILOSEC Take 20 mg by mouth 2 (two) times daily before a meal.   predniSONE 10 MG tablet Commonly known as: DELTASONE 40 mg once a day for a week followed by 30 mg for another week followed by 20 mg for another week and then 10 mg for 1 week Start taking on: October 20, 2022   saccharomyces boulardii 250 MG capsule Commonly known as: FLORASTOR Take 1 capsule (250 mg total) by mouth 2 (two) times daily for 7 days.   sertraline 100 MG tablet Commonly known as: ZOLOFT Take 200 mg by mouth every evening.   Stelara 90 MG/ML Sosy injection Generic drug: ustekinumab Inject 90 mg into the skin every 8 (eight) weeks.         The results of significant diagnostics from this hospitalization (including imaging, microbiology, ancillary and laboratory) are listed below for reference.    Procedures and Diagnostic Studies:   CT ABDOMEN PELVIS W CONTRAST  Result Date: 10/13/2022 CLINICAL DATA:  Abdominal pain. EXAM: CT ABDOMEN AND PELVIS WITH CONTRAST TECHNIQUE: Multidetector CT  imaging of the abdomen and pelvis was performed using the standard protocol following bolus administration of intravenous contrast. RADIATION DOSE REDUCTION: This exam was performed according to the departmental dose-optimization program which includes automated exposure control, adjustment of the mA and/or kV according to patient size and/or use of iterative reconstruction technique. CONTRAST:  31mL OMNIPAQUE IOHEXOL 350 MG/ML SOLN COMPARISON:  10/06/2022 FINDINGS: Lower chest: Small bilateral pleural effusions are new in the interval. Hepatobiliary: No suspicious focal abnormality within the liver parenchyma. Small area of low attenuation in the anterior liver, adjacent to the falciform ligament, is in a characteristic location for focal fatty deposition. Layering tiny calcified gallstones evident. No intrahepatic or extrahepatic biliary dilation. Pancreas: No focal mass lesion. No dilatation of the main duct. No intraparenchymal cyst. No peripancreatic edema. Spleen: No splenomegaly. No focal mass lesion. Adrenals/Urinary Tract: No adrenal nodule or mass. Kidneys unremarkable. No evidence for hydroureter. The urinary bladder appears normal for the degree of distention. Stomach/Bowel: Stomach is unremarkable. No gastric wall thickening. No evidence of outlet obstruction. Duodenum is normally positioned as is the ligament of Treitz. Jejunal loops are nondilated. There is a segment of ileum in the central pelvis there is dilated up to 4.7 cm diameter with dependent enterolith evident. As on the prior study, there is a 5-6 cm segment of distal ileum just proximal to the neo terminal ileum demonstrating circumferential wall thickening and perienteric edema/inflammation. This is immediately distal to the dilated segment consistent with inflammatory stricture. There is also a second area of circumferential wall thickening just proximal to the dilated segment (see image 64/3). Status post ileocecectomy. No gross colonic  mass. No colonic wall thickening. Vascular/Lymphatic: No abdominal aortic aneurysm. No abdominal aortic atherosclerotic calcification. There is no gastrohepatic or hepatoduodenal ligament lymphadenopathy. No  retroperitoneal or mesenteric lymphadenopathy. No pelvic sidewall lymphadenopathy. Reproductive: Unremarkable. Other: No intraperitoneal free fluid. Musculoskeletal: No worrisome lytic or sclerotic osseous abnormality. Small umbilical hernia contains only fat. IMPRESSION: 1. 5-6 cm segment of distal ileum just proximal to the neo terminal ileum demonstrating circumferential wall thickening and perienteric edema/inflammation. This is immediately distal to a dilated segment consistent with inflammatory stricture. There is some fluid along the more distal inflammatory stricture, but is less focal than on the prior study and no rim enhancing or organized mesenteric abscess evident on today's exam. As before, there is also a second area of circumferential wall thickening just proximal to the dilated segment. Generally, imaging features suggest inflammatory bowel disease. 2. As on the prior study, there is a second segment of abnormal small bowel just proximal to the dilated segment. 3. Small bilateral pleural effusions are new in the interval. 4. Cholelithiasis. Electronically Signed   By: Misty Stanley M.D.   On: 10/13/2022 09:33     Labs:   Basic Metabolic Panel: Recent Labs  Lab 10/13/22 0501 10/13/22 0521 10/14/22 0324 10/15/22 0337 10/17/22 0246 10/18/22 0217  NA 138  --  136 140 138 142  K 3.0*  --  4.2 3.7 2.8* 3.5  CL 101  --  104 106 108 112*  CO2 25  --  22 24 22 22   GLUCOSE 90  --  139* 111* 90 115*  BUN <5*  --  8 7 <5* <5*  CREATININE 1.14*  --  1.01* 0.87 0.89 0.90  CALCIUM 8.5*  --  8.5* 8.8* 8.2* 8.7*  MG  --  1.7  --  1.9 1.8 1.8  PHOS  --   --   --  2.8  --   --    GFR Estimated Creatinine Clearance: 92 mL/min (by C-G formula based on SCr of 0.9 mg/dL). Liver Function  Tests: Recent Labs  Lab 10/13/22 0501 10/14/22 0324 10/15/22 0337  AST 13* 14* 14*  ALT 16 16 17   ALKPHOS 32* 34* 27*  BILITOT 0.8 1.0 0.7  PROT 6.4* 6.5 6.4*  ALBUMIN 3.0* 2.9* 2.9*   Recent Labs  Lab 10/13/22 0501 10/15/22 1129  LIPASE 179* 82*   No results for input(s): "AMMONIA" in the last 168 hours. Coagulation profile No results for input(s): "INR", "PROTIME" in the last 168 hours.  CBC: Recent Labs  Lab 10/13/22 0501 10/14/22 0324 10/15/22 0337 10/17/22 0246  WBC 11.6* 9.2 8.6 7.2  NEUTROABS 9.5*  --  7.5  --   HGB 10.5* 10.6* 10.0* 9.3*  HCT 30.9* 31.1* 29.5* 27.6*  MCV 88.3 87.9 87.8 88.5  PLT 292 331 327 238   Cardiac Enzymes: No results for input(s): "CKTOTAL", "CKMB", "CKMBINDEX", "TROPONINI" in the last 168 hours. BNP: Invalid input(s): "POCBNP" CBG: No results for input(s): "GLUCAP" in the last 168 hours. D-Dimer No results for input(s): "DDIMER" in the last 72 hours. Hgb A1c No results for input(s): "HGBA1C" in the last 72 hours. Lipid Profile No results for input(s): "CHOL", "HDL", "LDLCALC", "TRIG", "CHOLHDL", "LDLDIRECT" in the last 72 hours. Thyroid function studies No results for input(s): "TSH", "T4TOTAL", "T3FREE", "THYROIDAB" in the last 72 hours.  Invalid input(s): "FREET3" Anemia work up No results for input(s): "VITAMINB12", "FOLATE", "FERRITIN", "TIBC", "IRON", "RETICCTPCT" in the last 72 hours. Microbiology No results found for this or any previous visit (from the past 240 hour(s)).  Time coordinating discharge: 35 minutes  Signed: Ibn Stief  [facility] 10/19/2022, 11:46 AM

## 2022-11-22 ENCOUNTER — Ambulatory Visit: Payer: Medicare Other

## 2022-11-23 ENCOUNTER — Ambulatory Visit
Admission: RE | Admit: 2022-11-23 | Discharge: 2022-11-23 | Disposition: A | Discharge status: Home / Self Care | Payer: Medicare Other | Source: Ambulatory Visit

## 2022-11-23 VITALS — BP 108/74 | HR 92 | Temp 99.2°F | Resp 15

## 2022-11-23 DIAGNOSIS — R109 Unspecified abdominal pain: Secondary | ICD-10-CM

## 2022-11-23 DIAGNOSIS — K50118 Crohn's disease of large intestine with other complication: Secondary | ICD-10-CM

## 2022-11-23 MED ORDER — METHYLPREDNISOLONE ACETATE 80 MG/ML IJ SUSP
80.0000 mg | Freq: Once | INTRAMUSCULAR | Status: AC
  Administered 2022-11-23: 80 mg via INTRAMUSCULAR

## 2022-11-23 MED ORDER — ACETAMINOPHEN 325 MG PO TABS
975.0000 mg | ORAL_TABLET | Freq: Once | ORAL | Status: AC
  Administered 2022-11-23: 975 mg via ORAL

## 2022-11-23 NOTE — ED Provider Notes (Signed)
[facility]    CSN: [id number] Arrival date & time: 11/23/22  1707    HISTORY   Chief Complaint  Patient presents with   Abdominal Pain    Crohns Disease - Entered by patient   HPI Sharon [name] is a pleasant, 42 y.o. female who presents to urgent care today. Patient reports a history of Crohn's disease.  Patient states she has been having a Crohn's flareup for the past week.  Patient is requesting pain relief today with Solu-Medrol until she can follow-up with her gastroenterologist.  The history is provided by the patient.   Past Medical History:  Diagnosis Date   Crohn's disease ([place])    dx 1996   History of abnormal cervical Pap smear    History of genital warts    Infertility    Pelvic peritoneal adhesions, female    Perirectal abscess    Patient Active Problem List   Diagnosis Date Noted   Crohn's colitis ([place]) 10/13/2022   Near syncope 10/13/2022   Orthostatic hypotension 10/13/2022   Normocytic anemia 99991111   Folic acid deficiency [id number]   Crohn's disease of ileum with abscess ([place]) 10/07/2022   Intractable nausea and vomiting 10/07/2022   Hypokalemia due to excessive gastrointestinal loss of potassium 10/06/2022   Dehydration 10/06/2022   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    Home Medications    Prior to Admission medications   Medication Sig Start Date End Date Taking? Authorizing Provider  metronidazole (FLAGYL) 375 MG capsule Take 375 mg by mouth 2 (two) times daily.   Yes [provider]  risankizumab-rzaa (SKYRIZI) 600 MG/10ML injection  Inject into the vein.   Yes [provider]  omeprazole (PRILOSEC) 20 MG capsule Take 20 mg by mouth 2 (two) times daily before a meal.    [provider]  predniSONE (DELTASONE) 10 MG tablet 40 mg once a day for a week followed by 30 mg for another week followed by 20 mg for another week and then 10 mg for 1 week Patient not taking: Reported on 11/23/2022 10/20/22   Terrilee Croak, MD  sertraline (ZOLOFT) 100 MG tablet Take 200 mg by mouth every evening. 09/28/22   [provider]  ustekinumab (STELARA) 90 MG/ML SOSY injection Inject 90 mg into the skin every 8 (eight) weeks.    [provider]    Family History Family History  Problem Relation Age of Onset   Healthy Brother    Social History Social History   Tobacco Use   Smoking status: Unknown   Smokeless tobacco: Never  Substance Use Topics   Alcohol use: No   Drug use: No   Allergies   Other  Review of Systems Review of Systems Pertinent findings revealed after performing a 14 point review of systems has been noted in the history of present illness.  Physical Exam Vital Signs BP 108/74 (BP Location: Right Arm)   Pulse 92   Temp 99.2 F (37.3 C) (Oral)   Resp 15   SpO2 96%   No data found.  Physical Exam Vitals and nursing  note reviewed.  Constitutional:      General: She is not in acute distress.    Appearance: Normal appearance.  HENT:     Head: Normocephalic and atraumatic.  Eyes:     Pupils: Pupils are equal, round, and reactive to light.  Cardiovascular:     Rate and Rhythm: Normal rate and regular rhythm.  Pulmonary:     Effort: Pulmonary effort is normal.     Breath sounds: Normal breath sounds.  Abdominal:     Tenderness: There is generalized abdominal tenderness.  Musculoskeletal:        General: Normal range of motion.     Cervical back: Normal range of motion and neck supple.  Skin:    General: Skin is warm and dry.  Neurological:     General: No focal  deficit present.     Mental Status: She is alert and oriented to person, place, and time. Mental status is at baseline.  Psychiatric:        Mood and Affect: Mood normal.        Behavior: Behavior normal.        Thought Content: Thought content normal.        Judgment: Judgment normal.     Visual Acuity Right Eye Distance:   Left Eye Distance:   Bilateral Distance:    Right Eye Near:   Left Eye Near:    Bilateral Near:     UC Couse / Diagnostics / Procedures:     Radiology No results found.  Procedures Procedures (including critical care time) EKG  Pending results:  Labs Reviewed - No data to display  Medications Ordered in UC: Medications  methylPREDNISolone acetate (DEPO-MEDROL) injection 80 mg (80 mg Intramuscular Given 11/23/22 1728)  acetaminophen (TYLENOL) tablet 975 mg (975 mg Oral Given 11/23/22 1730)    UC Diagnoses / Final Clinical Impressions(s)   I have reviewed the triage vital signs and the nursing notes.  Pertinent labs & imaging results that were available during my care of the patient were reviewed by me and considered in my medical decision making (see chart for details).    Final diagnoses:  Crohn's disease of colon with other complication ([place])  Abdominal pain, unspecified abdominal location   Patient provided with an injection of Solu-Medrol at her request along with 975 mg of Tylenol.  Patient advised to follow-up with her gastroenterologist tomorrow.  Emergency precautions advised.  Please see discharge instructions below for details of plan of care as provided to patient. ED Prescriptions   None    PDMP not reviewed this encounter.  Pending results:  Labs Reviewed - No data to display  Discharge Instructions:   Discharge Instructions      During your visit today, you received an injection of Depo-Medrol 80 mg and 975 mg of Tylenol.  Please be sure that you follow-up with your gastroenterologist first thing tomorrow morning to let  them know that you are having a Crohn's flare.  Imaging may be recommended.  I am glad we were able to do something for you today and I hope you feel better soon.  Thank you for visiting [facility].    Disposition Upon Discharge:  Condition: stable for discharge home  Patient presented with an acute illness with associated systemic symptoms and significant discomfort requiring urgent management. In my opinion, this is a condition that a prudent lay person (someone who possesses an average knowledge of health and medicine) may potentially expect to result  in complications if not addressed urgently such as respiratory distress, impairment of bodily function or dysfunction of bodily organs.   Routine symptom specific, illness specific and/or disease specific instructions were discussed with the patient and/or caregiver at length.   As such, the patient has been evaluated and assessed, work-up was performed and treatment was provided in alignment with urgent care protocols and evidence based medicine.  Patient/parent/caregiver has been advised that the patient may require follow up for further testing and treatment if the symptoms continue in spite of treatment, as clinically indicated and appropriate.  Patient/parent/caregiver has been advised to return to the [facility] or PCP if no better; to PCP or the Emergency Department if new signs and symptoms develop, or if the current signs or symptoms continue to change or worsen for further workup, evaluation and treatment as clinically indicated and appropriate  The patient will follow up with their current PCP if and as advised. If the patient does not currently have a PCP we will assist them in obtaining one.   The patient may need specialty follow up if the symptoms continue, in spite of conservative treatment and management, for further workup, evaluation, consultation and treatment as clinically indicated and  appropriate.  Patient/parent/caregiver verbalized understanding and agreement of plan as discussed.  All questions were addressed during visit.  Please see discharge instructions below for further details of plan.  This office note has been dictated using Museum/gallery curator.  Unfortunately, this method of dictation can sometimes lead to typographical or grammatical errors.  I apologize for your inconvenience in advance if this occurs.  Please do not hesitate to reach out to me if clarification is needed.      Lynden Oxford Scales, PA-C 11/23/22 1733

## 2022-11-23 NOTE — Discharge Instructions (Signed)
During your visit today, you received an injection of Depo-Medrol 80 mg and 975 mg of Tylenol.  Please be sure that you follow-up with your gastroenterologist first thing tomorrow morning to let them know that you are having a Crohn's flare.  Imaging may be recommended.  I am glad we were able to do something for you today and I hope you feel better soon.  Thank you for visiting [facility].

## 2022-11-23 NOTE — ED Triage Notes (Signed)
Pt presents with c/o crohns flare, abd pain x 1 week.

## 2022-11-24 ENCOUNTER — Telehealth: Payer: Self-pay | Admitting: Emergency Medicine

## 2022-12-28 ENCOUNTER — Inpatient Hospital Stay: Admission: RE | Admit: 2022-12-28 | Payer: Medicare Other | Source: Ambulatory Visit

## 2022-12-28 ENCOUNTER — Emergency Department (HOSPITAL_COMMUNITY): Payer: Medicare Other

## 2022-12-28 ENCOUNTER — Ambulatory Visit
Admission: RE | Admit: 2022-12-28 | Discharge: 2022-12-28 | Disposition: A | Discharge status: Home / Self Care | Payer: Medicare Other | Source: Ambulatory Visit | Attending: Internal Medicine | Admitting: Internal Medicine

## 2022-12-28 ENCOUNTER — Other Ambulatory Visit: Payer: Self-pay

## 2022-12-28 ENCOUNTER — Ambulatory Visit: Payer: Medicare Other

## 2022-12-28 ENCOUNTER — Emergency Department (HOSPITAL_COMMUNITY)
Admission: EM | Admit: 2022-12-28 | Discharge: 2022-12-28 | Disposition: A | Discharge status: Home / Self Care | Payer: Medicare Other | Attending: Emergency Medicine | Admitting: Emergency Medicine

## 2022-12-28 ENCOUNTER — Encounter (HOSPITAL_COMMUNITY): Payer: Self-pay

## 2022-12-28 DIAGNOSIS — M545 Low back pain, unspecified: Secondary | ICD-10-CM | POA: Insufficient documentation

## 2022-12-28 DIAGNOSIS — Y92410 Unspecified street and highway as the place of occurrence of the external cause: Secondary | ICD-10-CM | POA: Insufficient documentation

## 2022-12-28 DIAGNOSIS — S161XXA Strain of muscle, fascia and tendon at neck level, initial encounter: Secondary | ICD-10-CM | POA: Insufficient documentation

## 2022-12-28 DIAGNOSIS — S199XXA Unspecified injury of neck, initial encounter: Secondary | ICD-10-CM | POA: Diagnosis present

## 2022-12-28 DIAGNOSIS — M542 Cervicalgia: Secondary | ICD-10-CM | POA: Diagnosis not present

## 2022-12-28 DIAGNOSIS — R519 Headache, unspecified: Secondary | ICD-10-CM | POA: Insufficient documentation

## 2022-12-28 DIAGNOSIS — S0990XA Unspecified injury of head, initial encounter: Secondary | ICD-10-CM

## 2022-12-28 MED ORDER — HYDROCODONE-ACETAMINOPHEN 5-325 MG PO TABS
1.0000 | ORAL_TABLET | Freq: Once | ORAL | Status: AC
  Administered 2022-12-28: 1 via ORAL
  Filled 2022-12-28: qty 1

## 2022-12-28 MED ORDER — OXYCODONE-ACETAMINOPHEN 5-325 MG PO TABS
1.0000 | ORAL_TABLET | Freq: Once | ORAL | Status: AC
  Administered 2022-12-28: 1 via ORAL
  Filled 2022-12-28: qty 1

## 2022-12-28 MED ORDER — CYCLOBENZAPRINE HCL 10 MG PO TABS: 10.0000 mg | ORAL_TABLET | Freq: Two times a day (BID) | ORAL | 0 refills | Status: AC | PRN
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
## 2022-12-28 NOTE — ED Notes (Signed)
Pt states pain remains a 9/10. PA notified

## 2022-12-28 NOTE — Discharge Instructions (Signed)
Please go to the emergency department as soon as you leave urgent care for further evaluation and management. ?
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
## 2024-04-28 ENCOUNTER — Other Ambulatory Visit: Payer: Self-pay | Admitting: Medical Genetics

## 2024-04-28 DIAGNOSIS — Z006 Encounter for examination for normal comparison and control in clinical research program: Secondary | ICD-10-CM

## 2024-05-11 LAB — GENECONNECT MOLECULAR SCREEN: Genetic Analysis Overall Interpretation: NEGATIVE

## 2024-06-29 DIAGNOSIS — K5090 Crohn's disease, unspecified, without complications: Secondary | ICD-10-CM | POA: Diagnosis not present

## 2024-07-11 DIAGNOSIS — K5090 Crohn's disease, unspecified, without complications: Secondary | ICD-10-CM | POA: Diagnosis not present

## 2024-07-11 DIAGNOSIS — Z79899 Other long term (current) drug therapy: Secondary | ICD-10-CM | POA: Diagnosis not present

## 2024-07-11 DIAGNOSIS — D472 Monoclonal gammopathy: Secondary | ICD-10-CM | POA: Diagnosis not present

## 2024-07-17 DIAGNOSIS — K5000 Crohn's disease of small intestine without complications: Secondary | ICD-10-CM | POA: Diagnosis not present

## 2024-09-11 NOTE — Progress Notes (Signed)
 Sharon [name]                                          MRN: [id number]   09/11/2024   The [facility] Quality Team Specialist reviewed this patient medical record for the purposes of chart review for care gap closure. The following were reviewed: chart review for care gap closure-breast cancer screening.    [facility] Quality Team
